# Patient Record
Sex: Female | Born: 1990 | Hispanic: Yes | Smoking: Never smoker
Health system: Southern US, Community
[De-identification: ages and names within clinical notes are randomized; demographics above are authoritative.]

## PROBLEM LIST (undated history)

## (undated) ENCOUNTER — Inpatient Hospital Stay (HOSPITAL_COMMUNITY): Payer: Self-pay

## (undated) DIAGNOSIS — K219 Gastro-esophageal reflux disease without esophagitis: Secondary | ICD-10-CM

## (undated) DIAGNOSIS — R519 Headache, unspecified: Secondary | ICD-10-CM

## (undated) DIAGNOSIS — Z8619 Personal history of other infectious and parasitic diseases: Secondary | ICD-10-CM

## (undated) DIAGNOSIS — A749 Chlamydial infection, unspecified: Secondary | ICD-10-CM

## (undated) DIAGNOSIS — N368 Other specified disorders of urethra: Secondary | ICD-10-CM

## (undated) DIAGNOSIS — Z8669 Personal history of other diseases of the nervous system and sense organs: Secondary | ICD-10-CM

## (undated) DIAGNOSIS — Z789 Other specified health status: Secondary | ICD-10-CM

## (undated) HISTORY — DX: Personal history of other diseases of the nervous system and sense organs: Z86.69

## (undated) HISTORY — DX: Chlamydial infection, unspecified: A74.9

## (undated) HISTORY — DX: Gastro-esophageal reflux disease without esophagitis: K21.9

## (undated) HISTORY — PX: NO PAST SURGERIES: SHX2092

## (undated) HISTORY — DX: Headache, unspecified: R51.9

---

## 2011-03-16 NOTE — L&D Delivery Note (Addendum)
Delivery Note At 1:07 AM a viable female was delivered via Vaginal, Spontaneous Delivery (Presentation: Left Occiput Anterior).  APGAR: 8, 9; weight 7 lb 7.2 oz (3379 g).   Placenta status: Intact, Spontaneous.  Cord: 3 vessels with the following complications: None.    Anesthesia: Epidural  Episiotomy: None Lacerations: 1st degree;Perineal Suture Repair: 4.0 monocryl Est. Blood Loss (mL): 250  Mom to postpartum.  Baby to nursery-stable.  Desires to breastfeed, depo for contraception, desires circ- undecided about IP vs OP  Marge Duncans 01/16/2012, 4:13 AM

## 2011-09-03 ENCOUNTER — Encounter (HOSPITAL_COMMUNITY): Payer: Self-pay | Admitting: *Deleted

## 2011-09-03 ENCOUNTER — Inpatient Hospital Stay (HOSPITAL_COMMUNITY)
Admission: AD | Admit: 2011-09-03 | Discharge: 2011-09-03 | Disposition: A | Discharge status: Home / Self Care | Payer: Self-pay | Source: Ambulatory Visit | Attending: Obstetrics and Gynecology | Admitting: Obstetrics and Gynecology

## 2011-09-03 DIAGNOSIS — N390 Urinary tract infection, site not specified: Secondary | ICD-10-CM | POA: Insufficient documentation

## 2011-09-03 DIAGNOSIS — R109 Unspecified abdominal pain: Secondary | ICD-10-CM | POA: Insufficient documentation

## 2011-09-03 DIAGNOSIS — N949 Unspecified condition associated with female genital organs and menstrual cycle: Secondary | ICD-10-CM | POA: Insufficient documentation

## 2011-09-03 DIAGNOSIS — O2390 Unspecified genitourinary tract infection in pregnancy, unspecified trimester: Secondary | ICD-10-CM

## 2011-09-03 DIAGNOSIS — O2340 Unspecified infection of urinary tract in pregnancy, unspecified trimester: Secondary | ICD-10-CM

## 2011-09-03 HISTORY — DX: Other specified health status: Z78.9

## 2011-09-03 LAB — URINALYSIS, ROUTINE W REFLEX MICROSCOPIC
Glucose, UA: NEGATIVE mg/dL
Protein, ur: NEGATIVE mg/dL
Specific Gravity, Urine: 1.02 (ref 1.005–1.030)
pH: 7.5 (ref 5.0–8.0)

## 2011-09-03 LAB — WET PREP, GENITAL
Trich, Wet Prep: NONE SEEN
Yeast Wet Prep HPF POC: NONE SEEN

## 2011-09-03 LAB — URINE MICROSCOPIC-ADD ON

## 2011-09-03 MED ORDER — NITROFURANTOIN MONOHYD MACRO 100 MG PO CAPS: 100.0000 mg | ORAL_CAPSULE | Freq: Two times a day (BID) | ORAL | Status: AC

## 2011-09-03 NOTE — Discharge Instructions (Signed)
Infeccin del tracto urinario (Urinary Tract Infection) Las infecciones en el tracto urinario pueden comenzar en varios lugares. Una infeccin en la vejiga (cistitis), una infeccin en el rin (pielonefritis) o una infeccin en la prstata (prostatitis) son diferentes tipos de infeccin del tracto urinario. Por lo general mejoran si se los trata con antibiticos. Los antibiticos son medicamentos que matan grmenes. Tome todos los medicamentos que le han recetado hasta que se terminen. Podr sentirse bien dentro de unos das, pero DEBE TOMAR LOS MEDICAMENTOS HASTA TERMINAR EL TRATAMIENTO, de lo contrario la infeccin puede no solucionarse y luego ser ms difcil de tratar. INSTRUCCIONES PARA EL CUIDADO DOMICILIARIO  Beba gran cantidad de lquidos para mantener la orina de tono claro o color amarillo plido. Se recomienda especialmente el jugo de arndanos rojos, adems de grandes cantidades de agua.   Evite la cafena, el t y las bebidas con gas. Estas sustancias irritan la vejiga.   El alcohol puede irritar la prstata.   Utilice los medicamentos de venta libre o de prescripcin para el dolor, el malestar o la fiebre, segn se lo indique el profesional que lo asiste.  PARA PREVENIR FUTURAS INFECCIONES:  Vace la vejiga con frecuencia. Evite retener la orina durante largos perodos.   Despus de mover el intestino, las mujeres deben higienizarse la regin perineal desde adelante hacia atrs. Use cada papel tissue slo una vez.   Vace la vejiga antes y despus de tener relaciones sexuales.  OBTENER LOS RESULTADOS DE LAS PRUEBAS Durante su visita no contar con todos los resultados de los anlisis. En este caso, tenga otra entrevista con su mdico para conocerlos. No piense que el resultado es normal si no tiene noticias de su mdico o de la institucin mdica. Es importante el seguimiento de todos los resultados de los anlisis.  SOLICITE ATENCIN MDICA SI:  Siente dolor en la espalda.    El beb tiene ms de 3 meses y su temperatura rectal es de 100.5 F (38.1 C) o ms durante ms de 1 da.   Los problemas (sntomas) no mejoran en 3 das. Solicite atencin mdica antes si empeora.  SOLICITE ATENCIN MDICA DE INMEDIATO SI:  Comienza a sentir un dolor de espaldas o en la zona abdominal inferior intenso.   Comienza a sentir escalofros.   Tiene fiebre.   Su beb tiene ms de 3 meses y su temperatura rectal es de 102 F (38.9 C) o mayor.   Su beb tiene 3 meses o menos y su temperatura rectal es de 100.4 F (38 C) o mayor.   Siente nuseas o vmitos.   Tiene una sensacin continua de quemazn o molestias al orinar.  EST SEGURO QUE:  Comprende las instrucciones para el alta mdica.   Controlar su enfermedad.   Solicitar atencin mdica de inmediato segn las indicaciones.  Document Released: 12/09/2004 Document Revised: 02/18/2011 [employer] Patient Information 2012 [employer]. 

## 2011-09-03 NOTE — MAU Provider Note (Signed)
History     CSN: [id number]  Arrival date and time: 09/03/11 1142   First Provider Initiated Contact with Patient 09/03/11 1316      Chief Complaint  Patient presents with  . Abdominal Pain   HPI "abd pain for 3 days"  Ms. Felipa Furnace is a 21 yo G1P0 [redacted]w[redacted]d who presents with a 3 day h/o intermittent diffuse pelvic pain which she describes as feeling like her "period is coming on". Pt reports pain started in the lower quadrants and is now felt throughout.Associated sxs include nausea, subjective fever, and urgency.  Pt denies vomiting, d/c, chills, polyuria, or dysuria. Pt also denies any LOF or bleeding. Pt has not taken anything for the pain.  Last PAP: 4/12 - normal, GC/Chlamydia neg No pertinent PMH. No surgical Hx. No sick contacts or recent travel.  OB History    Grav Para Term Preterm Abortions TAB SAB Ect Mult Living   1               Past Medical History  Diagnosis Date  . No pertinent past medical history     Past Surgical History  Procedure Date  . No past surgeries     History reviewed. No pertinent family history.  History  Substance Use Topics  . Smoking status: Never Smoker   . Smokeless tobacco: Never Used  . Alcohol Use: No    Allergies: No Known Allergies  Prescriptions prior to admission  Medication Sig Dispense Refill  . acetaminophen (TYLENOL) 325 MG tablet Take 325 mg by mouth daily as needed. For pain      . Prenatal Vit-Fe Fumarate-FA (PRENATAL MULTIVITAMIN) TABS Take 1 tablet by mouth daily.        Review of Systems  Constitutional: Negative for fever, chills, weight loss and malaise/fatigue.  HENT: Negative for ear pain, congestion and sore throat.   Eyes: Negative for blurred vision, double vision and photophobia.  Respiratory: Negative for cough, shortness of breath and wheezing.   Cardiovascular: Negative for chest pain and palpitations.  Gastrointestinal: Positive for nausea and abdominal pain (diffuse). Negative for heartburn,  vomiting, diarrhea, constipation and blood in stool.  Genitourinary: Positive for urgency. Negative for dysuria, frequency, hematuria and flank pain.  Skin: Negative.   Neurological: Negative for dizziness, seizures and headaches.  Psychiatric/Behavioral: Negative for depression and substance abuse. The patient is not nervous/anxious.    Physical Exam   Blood pressure 110/57, pulse 104, temperature 98.2 F (36.8 C), temperature source Oral, resp. rate 16, height 5' (1.524 m), weight 48.353 kg (106 lb 9.6 oz), last menstrual period 04/23/2011, SpO2 100.00%.  Physical Exam  Constitutional: She appears well-developed and well-nourished. No distress.  HENT:  Head: Normocephalic.  Eyes: Pupils are equal, round, and reactive to light. No scleral icterus.  Cardiovascular: Normal rate, regular rhythm, normal heart sounds and intact distal pulses.   No murmur heard. Respiratory: Effort normal and breath sounds normal. No respiratory distress. She has no wheezes.  GI: Bowel sounds are normal. She exhibits mass (Uterus felt just below level of umblicus consistent with [redacted]w[redacted]d dating). She exhibits no distension. There is tenderness (RUQ and epigastric > lower quadrant). There is no rebound, no CVA tenderness, no tenderness at McBurney's point and negative Murphy's sign.  Neurological: She is alert.  Skin: Skin is warm and dry. No erythema.  Pelvic Exam: externally no masses, rashes or lesions, internally white thin vaginal discharge, no odor, wet prep and GC/chlamydia samples taken, bimanual exam cervical os closed, no  masses  Results for orders placed during the hospital encounter of 09/03/11 (from the past 24 hour(s))  URINALYSIS, ROUTINE W REFLEX MICROSCOPIC     Status: Abnormal   Collection Time   09/03/11  2:04 PM      Component Value Range   Color, Urine YELLOW  YELLOW   APPearance HAZY (*) CLEAR   Specific Gravity, Urine 1.020  1.005 - 1.030   pH 7.5  5.0 - 8.0   Glucose, UA NEGATIVE   NEGATIVE mg/dL   Hgb urine dipstick NEGATIVE  NEGATIVE   Bilirubin Urine NEGATIVE  NEGATIVE   Ketones, ur 15 (*) NEGATIVE mg/dL   Protein, ur NEGATIVE  NEGATIVE mg/dL   Urobilinogen, UA 0.2  0.0 - 1.0 mg/dL   Nitrite NEGATIVE  NEGATIVE   Leukocytes, UA TRACE (*) NEGATIVE  URINE MICROSCOPIC-ADD ON     Status: Abnormal   Collection Time   09/03/11  2:04 PM      Component Value Range   Squamous Epithelial / LPF FEW (*) RARE   WBC, UA 0-2  <3 WBC/hpf  WET PREP, GENITAL     Status: Abnormal   Collection Time   09/03/11  2:31 PM      Component Value Range   Yeast Wet Prep HPF POC NONE SEEN  NONE SEEN   Trich, Wet Prep NONE SEEN  NONE SEEN   Clue Cells Wet Prep HPF POC NONE SEEN  NONE SEEN   WBC, Wet Prep HPF POC FEW (*) NONE SEEN    MAU Course  Procedures  MDM UA - trace leukocytes GC/Chlamydia Wet Prep - trace WBCs Bedside US - FHR seen  Assessment and Plan  1. Pelvic Pain - likely round ligament pain Acetaminophen q6h prn for pain F/u with PCP in 7 days  2. UTI Macrobid BID x 7 days  Ricardo Jericho 09/03/2011, 1:39 PM   Seen and examined by me also. Agree with assessment and plan.

## 2011-09-03 NOTE — MAU Note (Signed)
Pt states she has had abd pain x 3 days in all four quadrants.  Describes like she is getting ready to start her menses and is intermit. In nature.  Denies vaginal bleeding or abdnormal discharge.

## 2011-09-05 LAB — URINE CULTURE
Colony Count: 25000
Culture  Setup Time: 201306220128

## 2011-09-06 LAB — GC/CHLAMYDIA PROBE AMP, GENITAL
Chlamydia, DNA Probe: POSITIVE — AB
GC Probe Amp, Genital: NEGATIVE

## 2011-09-06 NOTE — MAU Provider Note (Signed)
Agree with above note.  Dotsie Gillette 09/06/2011 1:15 PM   

## 2011-09-08 ENCOUNTER — Telehealth: Payer: Self-pay | Admitting: General Practice

## 2011-09-08 NOTE — Telephone Encounter (Signed)
Called patient with interpreter Angie to inform patient that her test results from MAU on 6/21 had come back positive for chlamydia and that she needs to go to [facility] to get treated as well as her partner or partners. Patient asked what chlamydia was and if it was serious or not. Informed patient that it was a sexually transmitted disease and that was probably where her abdominal pain had been coming from and that this was not necessarily serious but that it was very important that she go to the [facility] to get treatment. Patient verbalized understanding and had no further questions

## 2011-10-10 ENCOUNTER — Inpatient Hospital Stay (HOSPITAL_COMMUNITY)
Admit: 2011-10-10 | Discharge: 2011-10-10 | Disposition: A | Discharge status: Home / Self Care | Payer: Self-pay | Source: Ambulatory Visit | Attending: Obstetrics & Gynecology | Admitting: Obstetrics & Gynecology

## 2011-10-10 ENCOUNTER — Encounter (HOSPITAL_COMMUNITY): Payer: Self-pay | Admitting: *Deleted

## 2011-10-10 DIAGNOSIS — M545 Low back pain, unspecified: Secondary | ICD-10-CM | POA: Insufficient documentation

## 2011-10-10 DIAGNOSIS — O99891 Other specified diseases and conditions complicating pregnancy: Secondary | ICD-10-CM | POA: Insufficient documentation

## 2011-10-10 DIAGNOSIS — R109 Unspecified abdominal pain: Secondary | ICD-10-CM | POA: Insufficient documentation

## 2011-10-10 DIAGNOSIS — O4700 False labor before 37 completed weeks of gestation, unspecified trimester: Secondary | ICD-10-CM | POA: Insufficient documentation

## 2011-10-10 DIAGNOSIS — O479 False labor, unspecified: Secondary | ICD-10-CM

## 2011-10-10 LAB — URINE MICROSCOPIC-ADD ON

## 2011-10-10 LAB — URINALYSIS, ROUTINE W REFLEX MICROSCOPIC
Bilirubin Urine: NEGATIVE
Hgb urine dipstick: NEGATIVE
Nitrite: NEGATIVE
Specific Gravity, Urine: 1.02 (ref 1.005–1.030)
Urobilinogen, UA: 0.2 mg/dL (ref 0.0–1.0)
pH: 6 (ref 5.0–8.0)

## 2011-10-10 LAB — WET PREP, GENITAL

## 2011-10-10 NOTE — MAU Note (Signed)
Sharp abdominal and back pain since yesterday. No vaginal bleeding or discharge. Tylenol 1 tab every 8 hours since last night for pain.

## 2011-10-10 NOTE — MAU Provider Note (Signed)
  History     CSN: [id number]  Arrival date and time: 10/10/11 2017   None     Chief Complaint  Patient presents with  . Abdominal Pain   HPI This is a 21 y.o. female at [redacted]w[redacted]d who presents with c/o intermittent lower abdominal and low back pain. States belly does get hard when she has the pains. Last visit was here a month ago and she was positive for Chlamydia.  She states both she and her partner got treated.  [facility]  OB History    Grav Para Term Preterm Abortions TAB SAB Ect Mult Living   1               Past Medical History  Diagnosis Date  . No pertinent past medical history     Past Surgical History  Procedure Date  . No past surgeries     History reviewed. No pertinent family history.  History  Substance Use Topics  . Smoking status: Never Smoker   . Smokeless tobacco: Never Used  . Alcohol Use: No    Allergies: No Known Allergies  Prescriptions prior to admission  Medication Sig Dispense Refill  . acetaminophen (TYLENOL) 325 MG tablet Take 325 mg by mouth daily as needed. For pain      . Prenatal Vit-Fe Fumarate-FA (PRENATAL MULTIVITAMIN) TABS Take 1 tablet by mouth 3 (three) times daily.         Review of Systems  Constitutional: Negative for fever and malaise/fatigue.  Gastrointestinal: Positive for abdominal pain. Negative for nausea, vomiting, diarrhea and constipation.   As listed above.  Physical Exam   Blood pressure 109/62, pulse 99, temperature 98.5 F (36.9 C), temperature source Oral, resp. rate 20, height 5' 1.75" (1.568 m), weight 116 lb 2 oz (52.674 kg), last menstrual period 04/23/2011.  Physical Exam  Constitutional: She is oriented to person, place, and time. She appears well-developed and well-nourished. No distress.  Cardiovascular: Normal rate.   Respiratory: Effort normal.  GI: Soft. She exhibits no distension and no mass. There is no tenderness. There is no rebound and no guarding.    Genitourinary: Vagina normal and uterus normal. No vaginal discharge found.       Cultures done. (test of cure) Cervix long and closed.  Musculoskeletal: Normal range of motion.  Neurological: She is alert and oriented to person, place, and time.  Skin: Skin is warm and dry.  Psychiatric: She has a normal mood and affect.  FHR stable 150s. No contractions  MAU Course  Procedures  Assessment and Plan  A;  SIUP at [redacted]w[redacted]d       Round ligament pain vs BH contractions, closed cervix  P:  Discharge      [facility] appt at [facility]      PTL precautions  [facility] 10/10/2011, 9:40 PM

## 2011-10-11 LAB — GC/CHLAMYDIA PROBE AMP, GENITAL: Chlamydia, DNA Probe: NEGATIVE

## 2011-12-22 ENCOUNTER — Encounter: Payer: Self-pay | Admitting: Obstetrics and Gynecology

## 2011-12-22 ENCOUNTER — Ambulatory Visit (INDEPENDENT_AMBULATORY_CARE_PROVIDER_SITE_OTHER): Payer: Self-pay | Admitting: Obstetrics and Gynecology

## 2011-12-22 VITALS — BP 112/75 | Temp 98.5°F | Ht 61.0 in | Wt 131.0 lb

## 2011-12-22 DIAGNOSIS — O98319 Other infections with a predominantly sexual mode of transmission complicating pregnancy, unspecified trimester: Secondary | ICD-10-CM

## 2011-12-22 DIAGNOSIS — A749 Chlamydial infection, unspecified: Secondary | ICD-10-CM | POA: Insufficient documentation

## 2011-12-22 DIAGNOSIS — Z3400 Encounter for supervision of normal first pregnancy, unspecified trimester: Secondary | ICD-10-CM | POA: Insufficient documentation

## 2011-12-22 DIAGNOSIS — O0930 Supervision of pregnancy with insufficient antenatal care, unspecified trimester: Secondary | ICD-10-CM | POA: Insufficient documentation

## 2011-12-22 DIAGNOSIS — A568 Sexually transmitted chlamydial infection of other sites: Secondary | ICD-10-CM

## 2011-12-22 DIAGNOSIS — Z23 Encounter for immunization: Secondary | ICD-10-CM

## 2011-12-22 LAB — POCT URINALYSIS DIP (DEVICE)
Bilirubin Urine: NEGATIVE
Glucose, UA: NEGATIVE mg/dL
Hgb urine dipstick: NEGATIVE
Leukocytes, UA: NEGATIVE
Nitrite: NEGATIVE
Urobilinogen, UA: 0.2 mg/dL (ref 0.0–1.0)

## 2011-12-22 MED ORDER — INFLUENZA VIRUS VACC SPLIT PF IM SUSP: 0.5000 mL | Freq: Once | INTRAMUSCULAR | Status: DC

## 2011-12-22 NOTE — Patient Instructions (Signed)
Embarazo  Tercer trimestre  (Pregnancy - Third Trimester) El tercer trimestre del embarazo (los ltimos 3 meses) es el perodo en el cual tanto usted como su beb crecen con ms rapidez. El beb alcanza un largo de aproximadamente 50 cm. y pesa entre 2,700 y 4,500 kg. El beb gana ms tejido graso y est listo para la vida fuera del cuerpo de la madre. Mientras estn en el interior, los bebs tienen perodos de sueo y vigilia, succionan el pulgar y tienen hipo. Quizs sienta pequeas contracciones del tero. Este es el falso trabajo de parto. Tambin se las conoce como contracciones de Braxton-Hicks . Es como una prctica del parto. Los problemas ms habituales de esta etapa del embarazo incluyen mayor dificultad para respirar, hinchazn de las manos y los pies por retencin de lquidos y la necesidad de orinar con ms frecuencia debido a que el tero y el beb presionan sobre la vejiga.  EXAMENES PRENATALES   Durante los exmenes prenatales, deber seguir realizndose anlisis de sangre. Estas pruebas se realizan para controlar su salud y la del beb. Los anlisis de sangre se realizan para conocer los niveles de algunos compuestos de la sangre (hemoglobina). La anemia (bajo nivel de hemoglobina) es frecuente durante el embarazo. Para prevenirla, se administran hierro y vitaminas. Tambin le tomarn nuevas anlisis para descartar diabetes. Podrn repetirle algunas de las pruebas que le hicieron previamente.  En cada visita le medirn el tamao del tero. Esto permite asegurar que el beb se desarrolla adecuadamente, segn la fecha del embarazo.  Le controlarn la presin arterial en cada visita prenatal. Esto es para asegurarse de que no sufre toxemia.  Le harn un anlisis de orina en cada visita prenatal, para descartar infecciones, diabetes y la presencia de protenas.  Tambin en cada visita controlarn su peso. Esto se realiza para asegurarse que aumenta de peso al ritmo indicado y que usted y su  beb evolucionan normalmente.  En algunas ocasiones se realiza una prueba de ultrasonido para confirmar el correcto desarrollo y evolucin del beb. Esta prueba se realiza con ondas sonoras inofensivas para el beb, de modo que el profesional pueda calcular ms precisamente la fecha del parto.  Analice con su mdico los analgsicos y la anestesia que recibir durante el trabajo de parto y el parto.  Comente la posibilidad de que necesite una cesrea y qu anestesia se recibir.  Informe a su mdico si sufre violencia familiar mental o fsica. A veces, se indica la prueba especializada sin estrs, la prueba de tolerancia a las contracciones y el perfil biofsico para asegurarse de que el beb no tiene problemas. El estudio del lquido amnitico que rodea al beb se llama amniocentesis. El lquido amnitico se obtiene introduciendo una aguja en el vientre (abdomen ). En ocasiones se lleva a cabo cerca del final del embarazo, si es necesario inducir a un parto. En este caso se realiza para asegurarse que los pulmones del beb estn lo suficientemente maduros como para que pueda vivir fuera del tero. Si los pulmones no han madurado y es peligroso que el beb nazca, se administrar a la madre una inyeccin de cortisona , 1 a 2 das antes del parto. . Esto ayuda a que los pulmones del beb maduren y sea ms seguro su nacimiento.  CAMBIOS QUE OCURREN EN EL TERCER TRIMESTRE DEL EMBARAZO  Su organismo atravesar numerosos cambios durante el embarazo. Estos pueden variar de una persona a otra. Converse con el profesional que la asiste acerca los cambios que   usted note y que la preocupen.   Durante el ltimo trimestre probablemente sienta un aumento del apetito. Es normal tener "antojos" de ciertas comidas. Esto vara de una persona a otra y de un embarazo a otro.  Podrn aparecer las primeras estras en las caderas, abdomen y mamas. Estos son cambios normales del cuerpo durante el embarazo. No existen  medicamentos ni ejercicios que puedan prevenir estos cambios.  La constipacin puede tratarse con un laxante o agregando fibra a su dieta. Beber grandes cantidades de lquidos, tomar fibras en forma de vegetales, frutas y granos integrales es de gran ayuda.  Tambin es beneficioso practicar actividad fsica. Si ha sido una persona activa hasta el embarazo, podr continuar con la mayora de las actividades durante el mismo. Si ha sido menos activa, puede ser beneficioso que comience con un programa de ejercicios, como realizar caminatas. Consulte con el profesional que la asiste antes de comenzar un programa de ejercicios.  Evite el consumo de cigarrillos, el alcohol, los medicamentos no recetados y las "drogas de la calle" durante el embarazo. Estas sustancias qumicas afectan la formacin y el desarrollo del beb. Evite estas sustancias durante todo el embarazo para asegurar el nacimiento de un beb sano.  Podr sentir dolor de espalda, tener vrices en las venas y hemorroides, o si ya los sufra, pueden empeorar.  Durante el tercer trimestre se cansar con ms facilidad, lo cual es normal.  Los movimientos del beb pueden ser ms fuertes y con ms frecuencia.  Puede que note dificultades para respirar normalmente.  El ombligo puede salir hacia afuera.  A veces sale una secrecin amarilla de las mamas, que se llama calostro.  Podr aparecer una secrecin mucosa con sangre. Esto suele ocurrir entre unos pocos das y una semana antes del parto. INSTRUCCIONES PARA EL CUIDADO EN EL HOGAR   Cumpla con las citas de control. Siga las indicaciones del mdico con respecto al uso de medicamentos, los ejercicios y la dieta.  Durante el embarazo debe obtener nutrientes para usted y para su beb. Consuma alimentos balanceados a intervalos regulares. Elija alimentos como carne, pescado, leche y otros productos lcteos descremados, vegetales, frutas, panes integrales y cereales. El mdico le informar  cul es el aumento de peso ideal.  Las relaciones sexuales pueden continuarse hasta casi el final del embarazo, si no se presentan otros problemas como prdida prematura (antes de tiempo) de lquido amnitico, hemorragia vaginal o dolor en el vientre (abdominal).  Realice actividad fsica todos los das, si no tiene restricciones. Consulte con el profesional que la asiste si no sabe con certeza si determinados ejercicios son seguros. El mayor aumento de peso se producir en los ltimos 2 trimestres del embarazo. El ejercicio ayuda a:  Controlar su peso.  Mantenerse en forma para el trabajo de parto y el parto .  Perder peso despus del parto.  Haga reposo con frecuencia, con las piernas elevadas, o segn lo necesite para evitar los calambres y el dolor de cintura.  Use un buen sostn o como los que se usan para hacer deportes para aliviar la sensibilidad de las mamas. Tambin puede serle til si lo usa mientras duerme. Si pierde calostro, podr utilizar apsitos en el sostn.  No utilice la baera con agua caliente, baos turcos y saunas.  Colquese el cinturn de seguridad cuando conduzca. Este la proteger a usted y al beb en caso de accidente.  Evite comer carne cruda y el contacto con los utensilios y desperdicios de los gatos. Estos elementos   contienen grmenes que pueden causar defectos de nacimiento en el beb.  Es fcil perder algo de orina durante el embarazo. Apretar y fortalecer los msculos de la pelvis la ayudar con este problema. Practique detener la miccin cuando est en el bao. Estos son los mismos msculos que necesita fortalecer. Son tambin los mismos msculos que utiliza cuando trata de evitar despedir gases. Puede practicar apretando estos msculos diez veces, y repetir esto tres veces por da aproximadamente. Una vez que conozca qu msculos debe apretar, no realice estos ejercicios durante la miccin. Puede favorecerle una infeccin si la orina vuelve hacia  atrs.  Pida ayuda si tienen necesidades financieras, teraputicas o nutricionales. El profesional podr ayudarla con respecto a estas necesidades, o derivarla a otros especialistas.  Haga una lista de nmeros telefnicos de emergencia y tngalos disponibles.  Planifique como obtener ayuda de familiares o amigos cuando regrese a casa desde el hospital.  Hacer un ensayo sobre la partida al hospital.  Tome clases prenatales con el padre para entender, practicar y hacer preguntas sobre el trabajo de parto y el alumbramiento.  Preparar la habitacin del beb / busque una guardera.  No viaje fuera de la ciudad a menos que sea absolutamente necesario y con el asesoramiento de su mdico.  Use slo zapatos de tacn bajo o sin tacn para tener mejor equilibrio y evitar cadas. USO DE MEDICAMENTOS Y CONSUMO DE DROGAS DURANTE EL EMBARAZO   Tome las vitaminas apropiadas para esta etapa tal como se le indic. Las vitaminas deben contener un miligramo de cido flico. Guarde todas las vitaminas fuera del alcance de los nios. La ingestin de slo un par de vitaminas o tabletas que contengan hierro pueden ocasionar la muerte en un beb o en un nio pequeo.  Evite el uso de todos los medicamentos, incluyendo hierbas, medicamentos de venta libre, sin receta o que no hayan sido sugeridos por su mdico. Slo tome medicamentos de venta libre o medicamentos recetados para el dolor, el malestar o fiebre como lo indique su mdico. No tome aspirina, ibuprofeno (Motrin, Advil, Nuprin) o naproxeno (Aleve) excepto que su mdico se lo indique.  Infrmele al profesional si consume alguna droga.  El alcohol se relaciona con ciertos defectos congnitos. Incluye el sndrome de alcoholismo fetal. Debe evitar absolutamente el consumo de alcohol, en cualquier forma. El fumar produce baja tasa de natalidad y bebs prematuros.  Las drogas ilegales o de la calle son muy perjudiciales para el beb. Estn absolutamente  prohibidas. Un beb que nace de una madre adicta, ser adicto al nacer. Ese beb tendr los mismos sntomas de abstinencia que un adulto. SOLICITE ATENCIN MDICA SI:  Tiene preguntas o preocupaciones relacionadas con el embarazo. Es mejor que llame para formular las preguntas si no puede esperar hasta la prxima visita, que sentirse preocupada por ellas.  DECISIONES ACERCA DE LA CIRCUNCISIN  Usted puede saber o no cul es el sexo de su beb. Si ya sabe que ser un varn, este es el momento de pensar acerca de la circuncisin. La circuncisin es la extirpacin del prepucio. Esta es la piel que cubre el extremo sensible del pene. No hay un motivo mdico que lo justifique. Generalmente la decisin se toma segn lo que sea popular en ese momento, o segn creencias religiosas. Podr conversar estos temas con su mdico o con el pediatra.  SOLICITE ATENCIN MDICA DE INMEDIATO SI:   La temperatura oral le sube a ms de 102 F (38.9 C) o lo que su mdico le   indique.  Tiene una prdida de lquido por la vagina (canal de parto). Si sospecha una ruptura de las membranas, tmese la temperatura y llame al profesional para informarlo sobre esto.  Observa unas pequeas manchas, una hemorragia vaginal o elimina cogulos. Notifique al profesional acerca de la cantidad y de cuntos apsitos est utilizando.  Presenta un olor desagradable en la secrecin vaginal y observa un cambio en el color, de transparente a blanco.  Ha vomitado durante ms de 24 horas.  Siente escalofros o le sube la fiebre.  Le falta el aire.  Siente ardor al orinar.  Baja o sube ms de 2 libras (900 g), o segn lo indicado por el profesional que la asiste.  Observa que sbitamente se le hinchan el rostro, las manos, los pies o las piernas.  Siente dolor en el vientre (abdominal). Las molestias en el ligamento redondo son una causa benigna frecuente de dolor abdominal durante el embarazo. El profesional que la asiste deber  evaluarla.  Presenta dolor de cabeza intenso que no se alivia.  Tiene problemas visuales, visin doble o borrosa.  Si no siente los movimientos del beb durante ms de 1 hora. Si piensa que el beb no se mueve tanto como lo haca habitualmente, coma algo que contenga azcar y recustese sobre el lado izquierdo durante una hora. El beb debe moverse al menos 4  5 veces por hora. Comunquese inmediatamente si el beb se mueve menos que lo indicado.  Se cae, se ve involucrada en un accidente automovilstico o sufre algn tipo de traumatismo.  En su hogar hay violencia mental o fsica. Document Released: 12/09/2004 Document Revised: 08/31/2011 [employer] Patient Information 2013 [employer].  

## 2011-12-22 NOTE — Progress Notes (Signed)
Patient desires flu shot today and tdap at next visit.

## 2011-12-22 NOTE — Progress Notes (Signed)
Pulse 126

## 2011-12-22 NOTE — Progress Notes (Signed)
NOB visit  S;21 yo Hispanic female at 34 weeks 5 days by LMP who has had 2 MAU visits for minor complaints and no known pregnancy problems except Chlamydia which was treated and had negative test of cure. Denies irritative vaginitis, postcoital spotting, dysuria. I have reviewed her medications allergies past medical history and surgical history.  O: vital signs as above. Pulse recheck 104. Gen.: WN WD in NAD HEENT: Good dentition Neck: No thyromegaly Coronary: RRR no murmur Lungs: CTA bilateral Abdomen: Soft nontender, size equals dates Extremities: No edema or varicosities, pulse nl A: 21 year old Hispanic G1 with no prenatal care at 34 weeks 5 days Chlamydia this pregnancy treated with negative test of cure; friable cervix and abnormal appears to vaginal discharge today Language barrier P: prenatal labs, one-hour Glucola, tDAP, flu vaccine, wet prep done;  anatomic ultrasound scheduled Counseling pregnancy precautions and plans Follow up one week

## 2011-12-22 NOTE — Addendum Note (Signed)
Addended by: Gerome Apley on: 12/22/2011 09:42 AM   Modules accepted: Orders

## 2011-12-22 NOTE — Addendum Note (Signed)
Addended by: Franchot Mimes on: 12/22/2011 10:17 AM   Modules accepted: Orders

## 2011-12-23 LAB — WET PREP, GENITAL: Clue Cells Wet Prep HPF POC: NONE SEEN

## 2011-12-23 LAB — HIV ANTIBODY (ROUTINE TESTING W REFLEX): HIV: NONREACTIVE

## 2011-12-24 ENCOUNTER — Ambulatory Visit (HOSPITAL_COMMUNITY)
Admission: RE | Admit: 2011-12-24 | Discharge: 2011-12-24 | Disposition: A | Discharge status: Home / Self Care | Payer: Self-pay | Source: Ambulatory Visit | Attending: Obstetrics and Gynecology | Admitting: Obstetrics and Gynecology

## 2011-12-24 DIAGNOSIS — O0930 Supervision of pregnancy with insufficient antenatal care, unspecified trimester: Secondary | ICD-10-CM | POA: Insufficient documentation

## 2011-12-24 DIAGNOSIS — Z3689 Encounter for other specified antenatal screening: Secondary | ICD-10-CM | POA: Insufficient documentation

## 2011-12-24 DIAGNOSIS — Z3400 Encounter for supervision of normal first pregnancy, unspecified trimester: Secondary | ICD-10-CM

## 2011-12-24 LAB — OBSTETRIC PANEL
Antibody Screen: NEGATIVE
Basophils Relative: 0 % (ref 0–1)
Eosinophils Absolute: 0.1 10*3/uL (ref 0.0–0.7)
Eosinophils Relative: 1 % (ref 0–5)
HCT: 37.7 % (ref 36.0–46.0)
Hemoglobin: 12.6 g/dL (ref 12.0–15.0)
Hepatitis B Surface Ag: NEGATIVE
Lymphs Abs: 1.9 10*3/uL (ref 0.7–4.0)
MCH: 31.2 pg (ref 26.0–34.0)
MCHC: 33.4 g/dL (ref 30.0–36.0)
MCV: 93.3 fL (ref 78.0–100.0)
Monocytes Absolute: 0.9 10*3/uL (ref 0.1–1.0)
Monocytes Relative: 9 % (ref 3–12)
RBC: 4.04 MIL/uL (ref 3.87–5.11)
Rh Type: POSITIVE

## 2011-12-25 LAB — CULTURE, OB URINE: Colony Count: 50000

## 2011-12-29 ENCOUNTER — Ambulatory Visit (INDEPENDENT_AMBULATORY_CARE_PROVIDER_SITE_OTHER): Payer: Self-pay | Admitting: Advanced Practice Midwife

## 2011-12-29 VITALS — BP 126/76 | Temp 97.6°F | Wt 133.5 lb

## 2011-12-29 DIAGNOSIS — O0930 Supervision of pregnancy with insufficient antenatal care, unspecified trimester: Secondary | ICD-10-CM

## 2011-12-29 LAB — POCT URINALYSIS DIP (DEVICE)
Ketones, ur: NEGATIVE mg/dL
Leukocytes, UA: NEGATIVE
Protein, ur: NEGATIVE mg/dL
Specific Gravity, Urine: 1.015 (ref 1.005–1.030)
Urobilinogen, UA: 0.2 mg/dL (ref 0.0–1.0)
pH: 7 (ref 5.0–8.0)

## 2011-12-29 NOTE — Progress Notes (Signed)
Pulse- 98 

## 2011-12-29 NOTE — Patient Instructions (Signed)
Embarazo  Tercer trimestre  (Pregnancy - Third Trimester) El tercer trimestre del embarazo (los ltimos 3 meses) es el perodo en el cual tanto usted como su beb crecen con ms rapidez. El beb alcanza un largo de aproximadamente 50 cm. y pesa entre 2,700 y 4,500 kg. El beb gana ms tejido graso y est listo para la vida fuera del cuerpo de la madre. Mientras estn en el interior, los bebs tienen perodos de sueo y vigilia, succionan el pulgar y tienen hipo. Quizs sienta pequeas contracciones del tero. Este es el falso trabajo de parto. Tambin se las conoce como contracciones de Braxton-Hicks . Es como una prctica del parto. Los problemas ms habituales de esta etapa del embarazo incluyen mayor dificultad para respirar, hinchazn de las manos y los pies por retencin de lquidos y la necesidad de orinar con ms frecuencia debido a que el tero y el beb presionan sobre la vejiga.  EXAMENES PRENATALES   Durante los exmenes prenatales, deber seguir realizndose anlisis de sangre. Estas pruebas se realizan para controlar su salud y la del beb. Los anlisis de sangre se realizan para conocer los niveles de algunos compuestos de la sangre (hemoglobina). La anemia (bajo nivel de hemoglobina) es frecuente durante el embarazo. Para prevenirla, se administran hierro y vitaminas. Tambin le tomarn nuevas anlisis para descartar diabetes. Podrn repetirle algunas de las pruebas que le hicieron previamente.  En cada visita le medirn el tamao del tero. Esto permite asegurar que el beb se desarrolla adecuadamente, segn la fecha del embarazo.  Le controlarn la presin arterial en cada visita prenatal. Esto es para asegurarse de que no sufre toxemia.  Le harn un anlisis de orina en cada visita prenatal, para descartar infecciones, diabetes y la presencia de protenas.  Tambin en cada visita controlarn su peso. Esto se realiza para asegurarse que aumenta de peso al ritmo indicado y que usted y  su beb evolucionan normalmente.  En algunas ocasiones se realiza una prueba de ultrasonido para confirmar el correcto desarrollo y evolucin del beb. Esta prueba se realiza con ondas sonoras inofensivas para el beb, de modo que el profesional pueda calcular ms precisamente la fecha del parto.  Analice con su mdico los analgsicos y la anestesia que recibir durante el trabajo de parto y el parto.  Comente la posibilidad de que necesite una cesrea y qu anestesia se recibir.  Informe a su mdico si sufre violencia familiar mental o fsica. A veces, se indica la prueba especializada sin estrs, la prueba de tolerancia a las contracciones y el perfil biofsico para asegurarse de que el beb no tiene problemas. El estudio del lquido amnitico que rodea al beb se llama amniocentesis. El lquido amnitico se obtiene introduciendo una aguja en el vientre (abdomen ). En ocasiones se lleva a cabo cerca del final del embarazo, si es necesario inducir a un parto. En este caso se realiza para asegurarse que los pulmones del beb estn lo suficientemente maduros como para que pueda vivir fuera del tero. Si los pulmones no han madurado y es peligroso que el beb nazca, se administrar a la madre una inyeccin de cortisona , 1 a 2 das antes del parto. . Esto ayuda a que los pulmones del beb maduren y sea ms seguro su nacimiento.  CAMBIOS QUE OCURREN EN EL TERCER TRIMESTRE DEL EMBARAZO  Su organismo atravesar numerosos cambios durante el embarazo. Estos pueden variar de una persona a otra. Converse con el profesional que la asiste acerca los cambios que   usted note y que la preocupen.   Durante el ltimo trimestre probablemente sienta un aumento del apetito. Es normal tener "antojos" de ciertas comidas. Esto vara de una persona a otra y de un embarazo a otro.  Podrn aparecer las primeras estras en las caderas, abdomen y mamas. Estos son cambios normales del cuerpo durante el embarazo. No existen  medicamentos ni ejercicios que puedan prevenir estos cambios.  La constipacin puede tratarse con un laxante o agregando fibra a su dieta. Beber grandes cantidades de lquidos, tomar fibras en forma de vegetales, frutas y granos integrales es de gran ayuda.  Tambin es beneficioso practicar actividad fsica. Si ha sido una persona activa hasta el embarazo, podr continuar con la mayora de las actividades durante el mismo. Si ha sido menos activa, puede ser beneficioso que comience con un programa de ejercicios, como realizar caminatas. Consulte con el profesional que la asiste antes de comenzar un programa de ejercicios.  Evite el consumo de cigarrillos, el alcohol, los medicamentos no recetados y las "drogas de la calle" durante el embarazo. Estas sustancias qumicas afectan la formacin y el desarrollo del beb. Evite estas sustancias durante todo el embarazo para asegurar el nacimiento de un beb sano.  Podr sentir dolor de espalda, tener vrices en las venas y hemorroides, o si ya los sufra, pueden empeorar.  Durante el tercer trimestre se cansar con ms facilidad, lo cual es normal.  Los movimientos del beb pueden ser ms fuertes y con ms frecuencia.  Puede que note dificultades para respirar normalmente.  El ombligo puede salir hacia afuera.  A veces sale una secrecin amarilla de las mamas, que se llama calostro.  Podr aparecer una secrecin mucosa con sangre. Esto suele ocurrir entre unos pocos das y una semana antes del parto. INSTRUCCIONES PARA EL CUIDADO EN EL HOGAR   Cumpla con las citas de control. Siga las indicaciones del mdico con respecto al uso de medicamentos, los ejercicios y la dieta.  Durante el embarazo debe obtener nutrientes para usted y para su beb. Consuma alimentos balanceados a intervalos regulares. Elija alimentos como carne, pescado, leche y otros productos lcteos descremados, vegetales, frutas, panes integrales y cereales. El mdico le informar  cul es el aumento de peso ideal.  Las relaciones sexuales pueden continuarse hasta casi el final del embarazo, si no se presentan otros problemas como prdida prematura (antes de tiempo) de lquido amnitico, hemorragia vaginal o dolor en el vientre (abdominal).  Realice actividad fsica todos los das, si no tiene restricciones. Consulte con el profesional que la asiste si no sabe con certeza si determinados ejercicios son seguros. El mayor aumento de peso se producir en los ltimos 2 trimestres del embarazo. El ejercicio ayuda a:  Controlar su peso.  Mantenerse en forma para el trabajo de parto y el parto .  Perder peso despus del parto.  Haga reposo con frecuencia, con las piernas elevadas, o segn lo necesite para evitar los calambres y el dolor de cintura.  Use un buen sostn o como los que se usan para hacer deportes para aliviar la sensibilidad de las mamas. Tambin puede serle til si lo usa mientras duerme. Si pierde calostro, podr utilizar apsitos en el sostn.  No utilice la baera con agua caliente, baos turcos y saunas.  Colquese el cinturn de seguridad cuando conduzca. Este la proteger a usted y al beb en caso de accidente.  Evite comer carne cruda y el contacto con los utensilios y desperdicios de los gatos. Estos elementos   contienen grmenes que pueden causar defectos de nacimiento en el beb.  Es fcil perder algo de orina durante el embarazo. Apretar y fortalecer los msculos de la pelvis la ayudar con este problema. Practique detener la miccin cuando est en el bao. Estos son los mismos msculos que necesita fortalecer. Son tambin los mismos msculos que utiliza cuando trata de evitar despedir gases. Puede practicar apretando estos msculos diez veces, y repetir esto tres veces por da aproximadamente. Una vez que conozca qu msculos debe apretar, no realice estos ejercicios durante la miccin. Puede favorecerle una infeccin si la orina vuelve hacia  atrs.  Pida ayuda si tienen necesidades financieras, teraputicas o nutricionales. El profesional podr ayudarla con respecto a estas necesidades, o derivarla a otros especialistas.  Haga una lista de nmeros telefnicos de emergencia y tngalos disponibles.  Planifique como obtener ayuda de familiares o amigos cuando regrese a casa desde el hospital.  Hacer un ensayo sobre la partida al hospital.  Tome clases prenatales con el padre para entender, practicar y hacer preguntas sobre el trabajo de parto y el alumbramiento.  Preparar la habitacin del beb / busque una guardera.  No viaje fuera de la ciudad a menos que sea absolutamente necesario y con el asesoramiento de su mdico.  Use slo zapatos de tacn bajo o sin tacn para tener mejor equilibrio y evitar cadas. USO DE MEDICAMENTOS Y CONSUMO DE DROGAS DURANTE EL EMBARAZO   Tome las vitaminas apropiadas para esta etapa tal como se le indic. Las vitaminas deben contener un miligramo de cido flico. Guarde todas las vitaminas fuera del alcance de los nios. La ingestin de slo un par de vitaminas o tabletas que contengan hierro pueden ocasionar la muerte en un beb o en un nio pequeo.  Evite el uso de todos los medicamentos, incluyendo hierbas, medicamentos de venta libre, sin receta o que no hayan sido sugeridos por su mdico. Slo tome medicamentos de venta libre o medicamentos recetados para el dolor, el malestar o fiebre como lo indique su mdico. No tome aspirina, ibuprofeno (Motrin, Advil, Nuprin) o naproxeno (Aleve) excepto que su mdico se lo indique.  Infrmele al profesional si consume alguna droga.  El alcohol se relaciona con ciertos defectos congnitos. Incluye el sndrome de alcoholismo fetal. Debe evitar absolutamente el consumo de alcohol, en cualquier forma. El fumar produce baja tasa de natalidad y bebs prematuros.  Las drogas ilegales o de la calle son muy perjudiciales para el beb. Estn absolutamente  prohibidas. Un beb que nace de una madre adicta, ser adicto al nacer. Ese beb tendr los mismos sntomas de abstinencia que un adulto. SOLICITE ATENCIN MDICA SI:  Tiene preguntas o preocupaciones relacionadas con el embarazo. Es mejor que llame para formular las preguntas si no puede esperar hasta la prxima visita, que sentirse preocupada por ellas.  DECISIONES ACERCA DE LA CIRCUNCISIN  Usted puede saber o no cul es el sexo de su beb. Si ya sabe que ser un varn, este es el momento de pensar acerca de la circuncisin. La circuncisin es la extirpacin del prepucio. Esta es la piel que cubre el extremo sensible del pene. No hay un motivo mdico que lo justifique. Generalmente la decisin se toma segn lo que sea popular en ese momento, o segn creencias religiosas. Podr conversar estos temas con su mdico o con el pediatra.  SOLICITE ATENCIN MDICA DE INMEDIATO SI:   La temperatura oral le sube a ms de 102 F (38.9 C) o lo que su mdico le   indique.  Tiene una prdida de lquido por la vagina (canal de parto). Si sospecha una ruptura de las membranas, tmese la temperatura y llame al profesional para informarlo sobre esto.  Observa unas pequeas manchas, una hemorragia vaginal o elimina cogulos. Notifique al profesional acerca de la cantidad y de cuntos apsitos est utilizando.  Presenta un olor desagradable en la secrecin vaginal y observa un cambio en el color, de transparente a blanco.  Ha vomitado durante ms de 24 horas.  Siente escalofros o le sube la fiebre.  Le falta el aire.  Siente ardor al orinar.  Baja o sube ms de 2 libras (900 g), o segn lo indicado por el profesional que la asiste.  Observa que sbitamente se le hinchan el rostro, las manos, los pies o las piernas.  Siente dolor en el vientre (abdominal). Las molestias en el ligamento redondo son una causa benigna frecuente de dolor abdominal durante el embarazo. El profesional que la asiste deber  evaluarla.  Presenta dolor de cabeza intenso que no se alivia.  Tiene problemas visuales, visin doble o borrosa.  Si no siente los movimientos del beb durante ms de 1 hora. Si piensa que el beb no se mueve tanto como lo haca habitualmente, coma algo que contenga azcar y recustese sobre el lado izquierdo durante una hora. El beb debe moverse al menos 4  5 veces por hora. Comunquese inmediatamente si el beb se mueve menos que lo indicado.  Se cae, se ve involucrada en un accidente automovilstico o sufre algn tipo de traumatismo.  En su hogar hay violencia mental o fsica. Document Released: 12/09/2004 Document Revised: 08/31/2011 [employer] Patient Information 2013 [employer].  Lactancia materna  (Breastfeeding) Decidir amamantar es una de las mejores elecciones que puede hacer por usted y su beb. La informacin que se brinda a continuacin le dar una breve visin de los beneficios de la lactancia materna as como de las dudas ms frecuentes alrededor de ella.  LOS BENEFICIOS DE AMAMANTAR  Para el beb   La primera leche (calostro ) ayuda al mejor funcionamiento del sistema digestivo del beb.   La leche tiene anticuerpos que provienen de la madre y que ayudan a prevenir las infecciones en el beb.   El beb tiene una menor incidencia de asma, alergias y del sndrome de muerte sbita del lactante (SMSL).   Los nutrientes en la leche materna son mejores para el beb que los preparados para lactantes y la leche materna ayuda a un mejor desarrollo del cerebro del beb.   Los bebs amamantados tienen menos gases, clicos y estreimiento.  Para la mam   La lactancia materna favorece el desarrollo de un vnculo muy especial entre la madre y el beb.   Es ms conveniente, siempre disponible y a la temperatura adecuada y econmico.   Consume caloras en la madre y la ayuda a perder el peso ganado durante el embarazo.   Favorece la contraccin del tero a su  tamao normal, de manera ms rpida y disminuye las hemorragias luego del parto.   Las madres que amamantan tienen menor riesgo de desarrollar cncer de mama.  FRECUENCIA DEL AMAMANTAMIENTO   Un beb sano, nacido a trmino, puede amamantarse con tanta frecuencia como cada hora, o espaciar las comidas cada tres horas.   Observe al beb cuando manifieste signos de hambre. Amamante a su beb si muestra signos de hambre. Esta frecuencia variar de un beb a otro.   Amamntelo tan seguido como el beb lo   solicite, o cuando usted sienta la necesidad de aliviar sus mamas.   Despierte al beb si han pasado 3  4 horas desde la ltima comida.   El amamantamiento frecuente la ayudar a producir ms leche y a prevenir problemas de dolor en los pezones e hinchazn de las mamas.  POSICIN DEL BEBE PARA EL AMAMANTAMIENTO   Ya sea que se encuentre acostada o sentada, asegrese que el abdomen del beb enfrente el suyo.   Sostenga la mama con el pulgar por arriba y los otros 4 dedos por debajo. Asegrese que sus dedos se encuentren lejos del pezn y de la boca del beb.   Empuje suavemente los labios del beb con el pezn o con el dedo.   Cuando la boca del beb se abra lo suficiente, introduzca el pezn y la areola tanto como le sea posible dentro de la boca.   Coloque al beb cerca suyo de modo que su nariz y mejillas toquen las mamas al mamar.  ALIMENTACIN Y SUCCIN   La duracin de cada comida vara de un beb a otro y de una comida a otra.   El beb debe succionar entre 2 y 3 minutos para que le llegue leche. Esto se denomina "bajada". Por este motivo, permita que el nio se alimente en cada mama todo lo que desee. Terminar de mamar cuando haya recibido la cantidad adecuada de nutrientes.   Para detener la succin coloque su dedo en la comisura de la boca del nio y deslcelo entre sus encas antes de quitarle la mama de la boca. Esto la ayudar a evitar el dolor en los pezones.   COMO SABER SI EL BEB OBTIENE LA SUFICIENTE LECHE MATERNA  Preguntarse si el beb obtiene la cantidad suficiente de leche es una preocupacin frecuente entre las madres. Puede asegurarse que el beb tiene la leche suficiente si:   El beb succiona activamente y usted escucha que traga .   El beb parece estar relajado y satisfecho despus de mamar.   El nio se alimenta al menos 8 a 12 veces en 24 horas. Alimntelo hasta que se desprenda por sus propios medios o se quede dormido en la primera mama (al menos durante 10 a 20 minutos), luego ofrzcale el otro lado.   El beb moja 5 a 6 paales desechables (6 a 8 paales de tela) en 24 horas cuando tiene 5  6 das de vida.   Tiene al menos 3 a 4 deposiciones todos los das en los primeros meses. La materia fecal debe ser blanda y amarillenta.   El beb debe aumentar 4 a 6 libras (120 a 170 gr.) por semana despus de los 4 das de vida.   Siente que las mamas se ablandan despus de amamantar  REDUCIR LA CONGESTIN DE LAS MAMAS   Durante la primera semana despus del parto, usted puede experimentar hinchazn en las mamas. Cuando las mamas estn congestionadas, se sienten calientes, llenas y molestas al tacto. Puede reducir la congestin si:   Lo amamanta frecuentemente, cada 2-3 horas. Las mams que amamantan pronto y con frecuencia tienen menos problemas de congestin.   Coloque compresas de hielo en sus mamas durante 10-20 minutos entre cada amamantamiento. Esto ayuda a reducir la hinchazn. Envuelva las bolsas de hielo en una toalla liviana para proteger su piel. Las bolsas de vegetales congelados funcionan bien para este propsito.   Tome una ducha tibia o aplique compresas hmedas calientes en las mamas durante 5 a 10 minutos antes de   cada vez que amamanta. Esto aumenta la circulacin y ayuda a que la leche fluya.   Masajee suavemente la mama antes y durante la alimentacin. Con las puntas de los dedos, masajee desde la pared  torcica hacia abajo hasta llegar al pezn, con movimientos circulares.   Asegrese que el nio vaca al menos una mama antes de cambiar de lado.   Use un sacaleche para vaciar la mama si el beb se duerme o no se alimenta bien. Tambin podr quitarse la leche con esa bomba si tiene que volver al trabajo o siente que las mamas estn congestionadas.   Evite los biberones, chupetes o complementar la alimentacin con agua o jugos en lugar de la leche materna. La leche materna es todo el alimento que el beb necesita. No es necesario que el nio ingiera agua o preparados de bibern. De hecho, es lo mejor para ayudar a que las mamas produzcan ms leche. no darle suplementos al nio durante las primeras semanas.   Verifique que el beb se encuentra en la posicin correcta mientras lo alimenta.   Use un sostn que soporte bien sus mamas y evite los que tienen aro.   Consuma una dieta balanceada y beba lquidos en cantidad.   Descanse con frecuencia, reljese y tome sus vitaminas prenatales para evitar la fatiga, el estrs y la anemia.  Si sigue estas indicaciones, la congestin debe mejorar en 24 a 48 horas. Si an tiene dificultades, consulte a su asesor en lactancia.  CUDESE USTED MISMA  Cuide sus mamas.   Bese o dchese diariamente.   Evite usar jabn en los pezones.   Comience a amamantar del lado izquierdo en una comida y del lado derecho en la siguiente.   Notar que aumenta el flujo de leche a los 2 a 5 das despus del parto. Puede sentir algunas molestias por la congestin, lo que hace que sus mamas estn duras y sensibles. La congestin disminuye en 24 a 48 horas. Mientras tanto, aplique toallas hmedas calientes durante 5 a 10 minutos antes de amamantar. Un masaje suave y la extraccin de un poco de leche antes de amamantar ablandarn las mamas y har ms fcil que el beb se agarre.   Use un buen sostn y seque al aire los pezones durante 3 a 4 minutos luego de cada  alimentacin.   Solo utilice apsitos de algodn.   Utilice lanolina pura sobre los pezones luego de amamantar. No necesita lavarlos luego de alimentar al nio. Otra opcin es exprimir algunas gotas de leche y masajear suavemente los pezones.  Cumpla con estos cuidados   Consuma alimentos bien balanceados y refrigerios nutritivos.   Beba leche, jugos de fruta y agua para satisfacer la sed (alrededor de 8 vasos por da).   Descanse lo suficiente.  Evite los alimentos que usted note que pueden afectar al beb.  SOLICITE ATENCIN MDICA SI:   Tiene dificultad con la lactancia materna y necesita ayuda.   Tiene una zona de color rojo, dura y dolorosa en la mama que se acompaa de fiebre.   El beb est muy somnoliento como para alimentarse bien o tiene problemas para dormir.   Su beb moja menos de 6 paales al da, a los 5 das de vida.   La piel del beb o la parte blanca de sus ojos est ms amarilla de lo que estaba en el hospital.   Se siente deprimida.  Document Released: 03/01/2005 Document Revised: 08/31/2011 [employer] Patient Information 2013 [employer].  

## 2011-12-29 NOTE — Progress Notes (Signed)
GC and GBS done today, Declines cervical exam. Rev'd precautions. Follow up 1 week.

## 2011-12-29 NOTE — Progress Notes (Signed)
Subjective:    Monica Bryan 21 y.o. G1P0 [redacted]w[redacted]d  Follow-up prenatal: Patient doing well with no concerns today. She had some lower pelvic pain yesterday. She reports +fetal movement. No vaginal bleeding, no leaking/gush of fluid.      Patient to follow-up in 1 week.

## 2011-12-30 LAB — GC/CHLAMYDIA PROBE AMP, GENITAL
Chlamydia, DNA Probe: NEGATIVE
GC Probe Amp, Genital: NEGATIVE

## 2012-01-05 ENCOUNTER — Ambulatory Visit (INDEPENDENT_AMBULATORY_CARE_PROVIDER_SITE_OTHER): Payer: Self-pay | Admitting: Family Medicine

## 2012-01-05 VITALS — BP 128/79 | Temp 97.3°F | Wt 133.5 lb

## 2012-01-05 DIAGNOSIS — O0930 Supervision of pregnancy with insufficient antenatal care, unspecified trimester: Secondary | ICD-10-CM

## 2012-01-05 DIAGNOSIS — A749 Chlamydial infection, unspecified: Secondary | ICD-10-CM

## 2012-01-05 DIAGNOSIS — Z23 Encounter for immunization: Secondary | ICD-10-CM

## 2012-01-05 DIAGNOSIS — Z3400 Encounter for supervision of normal first pregnancy, unspecified trimester: Secondary | ICD-10-CM

## 2012-01-05 DIAGNOSIS — A568 Sexually transmitted chlamydial infection of other sites: Secondary | ICD-10-CM

## 2012-01-05 DIAGNOSIS — O98319 Other infections with a predominantly sexual mode of transmission complicating pregnancy, unspecified trimester: Secondary | ICD-10-CM

## 2012-01-05 LAB — POCT URINALYSIS DIP (DEVICE)
Hgb urine dipstick: NEGATIVE
Nitrite: NEGATIVE
Protein, ur: NEGATIVE mg/dL
Specific Gravity, Urine: 1.02 (ref 1.005–1.030)
Urobilinogen, UA: 0.2 mg/dL (ref 0.0–1.0)

## 2012-01-05 MED ORDER — TETANUS-DIPHTH-ACELL PERTUSSIS 5-2.5-18.5 LF-MCG/0.5 IM SUSP: 0.5000 mL | Freq: Once | INTRAMUSCULAR | Status: DC

## 2012-01-05 NOTE — Progress Notes (Signed)
Pulse: 97

## 2012-01-05 NOTE — Patient Instructions (Addendum)
Embarazo  Tercer trimestre  (Pregnancy - Third Trimester) El tercer trimestre del embarazo (los ltimos 3 meses) es el perodo en el cual tanto usted como su beb crecen con ms rapidez. El beb alcanza un largo de aproximadamente 50 cm. y pesa entre 2,700 y 4,500 kg. El beb gana ms tejido graso y est listo para la vida fuera del cuerpo de la madre. Mientras estn en el interior, los bebs tienen perodos de sueo y vigilia, succionan el pulgar y tienen hipo. Quizs sienta pequeas contracciones del tero. Este es el falso trabajo de parto. Tambin se las conoce como contracciones de Braxton-Hicks . Es como una prctica del parto. Los problemas ms habituales de esta etapa del embarazo incluyen mayor dificultad para respirar, hinchazn de las manos y los pies por retencin de lquidos y la necesidad de orinar con ms frecuencia debido a que el tero y el beb presionan sobre la vejiga.  EXAMENES PRENATALES   Durante los exmenes prenatales, deber seguir realizndose anlisis de sangre. Estas pruebas se realizan para controlar su salud y la del beb. Los anlisis de sangre se realizan para conocer los niveles de algunos compuestos de la sangre (hemoglobina). La anemia (bajo nivel de hemoglobina) es frecuente durante el embarazo. Para prevenirla, se administran hierro y vitaminas. Tambin le tomarn nuevas anlisis para descartar diabetes. Podrn repetirle algunas de las pruebas que le hicieron previamente.  En cada visita le medirn el tamao del tero. Esto permite asegurar que el beb se desarrolla adecuadamente, segn la fecha del embarazo.  Le controlarn la presin arterial en cada visita prenatal. Esto es para asegurarse de que no sufre toxemia.  Le harn un anlisis de orina en cada visita prenatal, para descartar infecciones, diabetes y la presencia de protenas.  Tambin en cada visita controlarn su peso. Esto se realiza para asegurarse que aumenta de peso al ritmo indicado y que usted y su  beb evolucionan normalmente.  En algunas ocasiones se realiza una prueba de ultrasonido para confirmar el correcto desarrollo y evolucin del beb. Esta prueba se realiza con ondas sonoras inofensivas para el beb, de modo que el profesional pueda calcular ms precisamente la fecha del parto.  Analice con su mdico los analgsicos y la anestesia que recibir durante el trabajo de parto y el parto.  Comente la posibilidad de que necesite una cesrea y qu anestesia se recibir.  Informe a su mdico si sufre violencia familiar mental o fsica. A veces, se indica la prueba especializada sin estrs, la prueba de tolerancia a las contracciones y el perfil biofsico para asegurarse de que el beb no tiene problemas. El estudio del lquido amnitico que rodea al beb se llama amniocentesis. El lquido amnitico se obtiene introduciendo una aguja en el vientre (abdomen ). En ocasiones se lleva a cabo cerca del final del embarazo, si es necesario inducir a un parto. En este caso se realiza para asegurarse que los pulmones del beb estn lo suficientemente maduros como para que pueda vivir fuera del tero. Si los pulmones no han madurado y es peligroso que el beb nazca, se administrar a la madre una inyeccin de cortisona , 1 a 2 das antes del parto. . Esto ayuda a que los pulmones del beb maduren y sea ms seguro su nacimiento.  CAMBIOS QUE OCURREN EN EL TERCER TRIMESTRE DEL EMBARAZO  Su organismo atravesar numerosos cambios durante el embarazo. Estos pueden variar de una persona a otra. Converse con el profesional que la asiste acerca los cambios que   usted note y que la preocupen.   Durante el ltimo trimestre probablemente sienta un aumento del apetito. Es normal tener "antojos" de ciertas comidas. Esto vara de una persona a otra y de un embarazo a otro.  Podrn aparecer las primeras estras en las caderas, abdomen y mamas. Estos son cambios normales del cuerpo durante el embarazo. No existen  medicamentos ni ejercicios que puedan prevenir estos cambios.  La constipacin puede tratarse con un laxante o agregando fibra a su dieta. Beber grandes cantidades de lquidos, tomar fibras en forma de vegetales, frutas y granos integrales es de gran ayuda.  Tambin es beneficioso practicar actividad fsica. Si ha sido una persona activa hasta el embarazo, podr continuar con la mayora de las actividades durante el mismo. Si ha sido menos activa, puede ser beneficioso que comience con un programa de ejercicios, como realizar caminatas. Consulte con el profesional que la asiste antes de comenzar un programa de ejercicios.  Evite el consumo de cigarrillos, el alcohol, los medicamentos no recetados y las "drogas de la calle" durante el embarazo. Estas sustancias qumicas afectan la formacin y el desarrollo del beb. Evite estas sustancias durante todo el embarazo para asegurar el nacimiento de un beb sano.  Podr sentir dolor de espalda, tener vrices en las venas y hemorroides, o si ya los sufra, pueden empeorar.  Durante el tercer trimestre se cansar con ms facilidad, lo cual es normal.  Los movimientos del beb pueden ser ms fuertes y con ms frecuencia.  Puede que note dificultades para respirar normalmente.  El ombligo puede salir hacia afuera.  A veces sale una secrecin amarilla de las mamas, que se llama calostro.  Podr aparecer una secrecin mucosa con sangre. Esto suele ocurrir entre unos pocos das y una semana antes del parto. INSTRUCCIONES PARA EL CUIDADO EN EL HOGAR   Cumpla con las citas de control. Siga las indicaciones del mdico con respecto al uso de medicamentos, los ejercicios y la dieta.  Durante el embarazo debe obtener nutrientes para usted y para su beb. Consuma alimentos balanceados a intervalos regulares. Elija alimentos como carne, pescado, leche y otros productos lcteos descremados, vegetales, frutas, panes integrales y cereales. El mdico le informar  cul es el aumento de peso ideal.  Las relaciones sexuales pueden continuarse hasta casi el final del embarazo, si no se presentan otros problemas como prdida prematura (antes de tiempo) de lquido amnitico, hemorragia vaginal o dolor en el vientre (abdominal).  Realice actividad fsica todos los das, si no tiene restricciones. Consulte con el profesional que la asiste si no sabe con certeza si determinados ejercicios son seguros. El mayor aumento de peso se producir en los ltimos 2 trimestres del embarazo. El ejercicio ayuda a:  Controlar su peso.  Mantenerse en forma para el trabajo de parto y el parto .  Perder peso despus del parto.  Haga reposo con frecuencia, con las piernas elevadas, o segn lo necesite para evitar los calambres y el dolor de cintura.  Use un buen sostn o como los que se usan para hacer deportes para aliviar la sensibilidad de las mamas. Tambin puede serle til si lo usa mientras duerme. Si pierde calostro, podr utilizar apsitos en el sostn.  No utilice la baera con agua caliente, baos turcos y saunas.  Colquese el cinturn de seguridad cuando conduzca. Este la proteger a usted y al beb en caso de accidente.  Evite comer carne cruda y el contacto con los utensilios y desperdicios de los gatos. Estos elementos   contienen grmenes que pueden causar defectos de nacimiento en el beb.  Es fcil perder algo de orina durante el embarazo. Apretar y fortalecer los msculos de la pelvis la ayudar con este problema. Practique detener la miccin cuando est en el bao. Estos son los mismos msculos que necesita fortalecer. Son tambin los mismos msculos que utiliza cuando trata de evitar despedir gases. Puede practicar apretando estos msculos diez veces, y repetir esto tres veces por da aproximadamente. Una vez que conozca qu msculos debe apretar, no realice estos ejercicios durante la miccin. Puede favorecerle una infeccin si la orina vuelve hacia  atrs.  Pida ayuda si tienen necesidades financieras, teraputicas o nutricionales. El profesional podr ayudarla con respecto a estas necesidades, o derivarla a otros especialistas.  Haga una lista de nmeros telefnicos de emergencia y tngalos disponibles.  Planifique como obtener ayuda de familiares o amigos cuando regrese a casa desde el hospital.  Hacer un ensayo sobre la partida al hospital.  Tome clases prenatales con el padre para entender, practicar y hacer preguntas sobre el trabajo de parto y el alumbramiento.  Preparar la habitacin del beb / busque una guardera.  No viaje fuera de la ciudad a menos que sea absolutamente necesario y con el asesoramiento de su mdico.  Use slo zapatos de tacn bajo o sin tacn para tener mejor equilibrio y evitar cadas. USO DE MEDICAMENTOS Y CONSUMO DE DROGAS DURANTE EL EMBARAZO   Tome las vitaminas apropiadas para esta etapa tal como se le indic. Las vitaminas deben contener un miligramo de cido flico. Guarde todas las vitaminas fuera del alcance de los nios. La ingestin de slo un par de vitaminas o tabletas que contengan hierro pueden ocasionar la muerte en un beb o en un nio pequeo.  Evite el uso de todos los medicamentos, incluyendo hierbas, medicamentos de venta libre, sin receta o que no hayan sido sugeridos por su mdico. Slo tome medicamentos de venta libre o medicamentos recetados para el dolor, el malestar o fiebre como lo indique su mdico. No tome aspirina, ibuprofeno (Motrin, Advil, Nuprin) o naproxeno (Aleve) excepto que su mdico se lo indique.  Infrmele al profesional si consume alguna droga.  El alcohol se relaciona con ciertos defectos congnitos. Incluye el sndrome de alcoholismo fetal. Debe evitar absolutamente el consumo de alcohol, en cualquier forma. El fumar produce baja tasa de natalidad y bebs prematuros.  Las drogas ilegales o de la calle son muy perjudiciales para el beb. Estn absolutamente  prohibidas. Un beb que nace de una madre adicta, ser adicto al nacer. Ese beb tendr los mismos sntomas de abstinencia que un adulto. SOLICITE ATENCIN MDICA SI:  Tiene preguntas o preocupaciones relacionadas con el embarazo. Es mejor que llame para formular las preguntas si no puede esperar hasta la prxima visita, que sentirse preocupada por ellas.  DECISIONES ACERCA DE LA CIRCUNCISIN  Usted puede saber o no cul es el sexo de su beb. Si ya sabe que ser un varn, este es el momento de pensar acerca de la circuncisin. La circuncisin es la extirpacin del prepucio. Esta es la piel que cubre el extremo sensible del pene. No hay un motivo mdico que lo justifique. Generalmente la decisin se toma segn lo que sea popular en ese momento, o segn creencias religiosas. Podr conversar estos temas con su mdico o con el pediatra.  SOLICITE ATENCIN MDICA DE INMEDIATO SI:   La temperatura oral le sube a ms de 102 F (38.9 C) o lo que su mdico le   indique.  Tiene una prdida de lquido por la vagina (canal de parto). Si sospecha una ruptura de las membranas, tmese la temperatura y llame al profesional para informarlo sobre esto.  Observa unas pequeas manchas, una hemorragia vaginal o elimina cogulos. Notifique al profesional acerca de la cantidad y de cuntos apsitos est utilizando.  Presenta un olor desagradable en la secrecin vaginal y observa un cambio en el color, de transparente a blanco.  Ha vomitado durante ms de 24 horas.  Siente escalofros o le sube la fiebre.  Le falta el aire.  Siente ardor al orinar.  Baja o sube ms de 2 libras (900 g), o segn lo indicado por el profesional que la asiste.  Observa que sbitamente se le hinchan el rostro, las manos, los pies o las piernas.  Siente dolor en el vientre (abdominal). Las molestias en el ligamento redondo son una causa benigna frecuente de dolor abdominal durante el embarazo. El profesional que la asiste deber  evaluarla.  Presenta dolor de cabeza intenso que no se alivia.  Tiene problemas visuales, visin doble o borrosa.  Si no siente los movimientos del beb durante ms de 1 hora. Si piensa que el beb no se mueve tanto como lo haca habitualmente, coma algo que contenga azcar y recustese sobre el lado izquierdo durante una hora. El beb debe moverse al menos 4  5 veces por hora. Comunquese inmediatamente si el beb se mueve menos que lo indicado.  Se cae, se ve involucrada en un accidente automovilstico o sufre algn tipo de traumatismo.  En su hogar hay violencia mental o fsica. Document Released: 12/09/2004 Document Revised: 08/31/2011 [employer] Patient Information 2013 [employer].  

## 2012-01-05 NOTE — Progress Notes (Signed)
No complaints. Sono 10/11 showed EFW >90% but fundal height is on low side for dates. 1 hour GTT 104. Other IOB labs negative. F/U 1 week. GBS negative.

## 2012-01-12 ENCOUNTER — Ambulatory Visit (INDEPENDENT_AMBULATORY_CARE_PROVIDER_SITE_OTHER): Payer: Self-pay | Admitting: Advanced Practice Midwife

## 2012-01-12 VITALS — BP 123/82 | Temp 97.7°F | Wt 134.5 lb

## 2012-01-12 DIAGNOSIS — O98319 Other infections with a predominantly sexual mode of transmission complicating pregnancy, unspecified trimester: Secondary | ICD-10-CM

## 2012-01-12 DIAGNOSIS — O0930 Supervision of pregnancy with insufficient antenatal care, unspecified trimester: Secondary | ICD-10-CM

## 2012-01-12 DIAGNOSIS — Z3400 Encounter for supervision of normal first pregnancy, unspecified trimester: Secondary | ICD-10-CM

## 2012-01-12 LAB — POCT URINALYSIS DIP (DEVICE)
Hgb urine dipstick: NEGATIVE
Nitrite: NEGATIVE
Protein, ur: NEGATIVE mg/dL
Specific Gravity, Urine: 1.025 (ref 1.005–1.030)
Urobilinogen, UA: 1 mg/dL (ref 0.0–1.0)

## 2012-01-12 NOTE — Progress Notes (Signed)
Doing well.  Good fetal movement, denies vaginal bleeding, LOF, regular contractions.  Reviewed signs of labor.  

## 2012-01-12 NOTE — Addendum Note (Signed)
Addended by: Sharen Counter A on: 01/12/2012 12:37 PM   Modules accepted: Level of Service

## 2012-01-12 NOTE — Progress Notes (Signed)
Pulse- 93  Pain- lower abd

## 2012-01-15 ENCOUNTER — Encounter (HOSPITAL_COMMUNITY): Payer: Self-pay | Admitting: *Deleted

## 2012-01-15 ENCOUNTER — Inpatient Hospital Stay (HOSPITAL_COMMUNITY)
Admission: AD | Admit: 2012-01-15 | Discharge: 2012-01-17 | DRG: 775 | Disposition: A | Discharge status: Home / Self Care | Payer: Medicaid Other | Source: Ambulatory Visit | Attending: Family Medicine | Admitting: Family Medicine

## 2012-01-15 ENCOUNTER — Encounter (HOSPITAL_COMMUNITY): Payer: Self-pay | Admitting: Anesthesiology

## 2012-01-15 ENCOUNTER — Inpatient Hospital Stay (HOSPITAL_COMMUNITY): Payer: Medicaid Other | Admitting: Anesthesiology

## 2012-01-15 DIAGNOSIS — O4290 Premature rupture of membranes, unspecified as to length of time between rupture and onset of labor, unspecified weeks of gestation: Secondary | ICD-10-CM

## 2012-01-15 LAB — CBC
HCT: 40.5 % (ref 36.0–46.0)
Hemoglobin: 13.7 g/dL (ref 12.0–15.0)
MCH: 31.1 pg (ref 26.0–34.0)
MCHC: 33.8 g/dL (ref 30.0–36.0)
MCV: 91.8 fL (ref 78.0–100.0)

## 2012-01-15 LAB — URINALYSIS, ROUTINE W REFLEX MICROSCOPIC
Bilirubin Urine: NEGATIVE
Ketones, ur: 15 mg/dL — AB
Nitrite: NEGATIVE
Protein, ur: NEGATIVE mg/dL
Specific Gravity, Urine: 1.01 (ref 1.005–1.030)
Urobilinogen, UA: 0.2 mg/dL (ref 0.0–1.0)

## 2012-01-15 LAB — URINE MICROSCOPIC-ADD ON

## 2012-01-15 LAB — ABO/RH: ABO/RH(D): O POS

## 2012-01-15 LAB — TYPE AND SCREEN: Antibody Screen: NEGATIVE

## 2012-01-15 MED ORDER — FENTANYL CITRATE 0.05 MG/ML IJ SOLN
50.0000 ug | INTRAMUSCULAR | Status: DC | PRN
  Administered 2012-01-15 (×2): 50 ug via INTRAVENOUS
  Filled 2012-01-15 (×2): qty 2

## 2012-01-15 MED ORDER — TERBUTALINE SULFATE 1 MG/ML IJ SOLN: 0.2500 mg | Freq: Once | INTRAMUSCULAR | Status: AC | PRN

## 2012-01-15 MED ORDER — OXYTOCIN 40 UNITS IN LACTATED RINGERS INFUSION - SIMPLE MED: 62.5000 mL/h | INTRAVENOUS | Status: DC

## 2012-01-15 MED ORDER — EPHEDRINE 5 MG/ML INJ
10.0000 mg | INTRAVENOUS | Status: DC | PRN
  Filled 2012-01-15: qty 4

## 2012-01-15 MED ORDER — MISOPROSTOL 25 MCG QUARTER TABLET: 25.0000 ug | ORAL_TABLET | ORAL | Status: DC | PRN

## 2012-01-15 MED ORDER — IBUPROFEN 600 MG PO TABS: 600.0000 mg | ORAL_TABLET | Freq: Four times a day (QID) | ORAL | Status: DC | PRN

## 2012-01-15 MED ORDER — LIDOCAINE HCL (PF) 1 % IJ SOLN
INTRAMUSCULAR | Status: DC | PRN
  Administered 2012-01-15 (×2): 4 mL

## 2012-01-15 MED ORDER — ONDANSETRON HCL 4 MG/2ML IJ SOLN
4.0000 mg | Freq: Four times a day (QID) | INTRAMUSCULAR | Status: DC | PRN
  Administered 2012-01-15: 4 mg via INTRAVENOUS
  Filled 2012-01-15: qty 2

## 2012-01-15 MED ORDER — LACTATED RINGERS IV SOLN: 500.0000 mL | INTRAVENOUS | Status: DC | PRN

## 2012-01-15 MED ORDER — LACTATED RINGERS IV SOLN
INTRAVENOUS | Status: DC
  Administered 2012-01-15 (×2): via INTRAVENOUS

## 2012-01-15 MED ORDER — LIDOCAINE HCL (PF) 1 % IJ SOLN
30.0000 mL | INTRAMUSCULAR | Status: DC | PRN
  Filled 2012-01-15: qty 30

## 2012-01-15 MED ORDER — HYDROXYZINE HCL 50 MG/ML IM SOLN: 50.0000 mg | Freq: Four times a day (QID) | INTRAMUSCULAR | Status: DC | PRN

## 2012-01-15 MED ORDER — LACTATED RINGERS IV SOLN: 500.0000 mL | Freq: Once | INTRAVENOUS | Status: DC

## 2012-01-15 MED ORDER — OXYTOCIN 40 UNITS IN LACTATED RINGERS INFUSION - SIMPLE MED
1.0000 m[IU]/min | INTRAVENOUS | Status: DC
  Administered 2012-01-15: 2 m[IU]/min via INTRAVENOUS
  Filled 2012-01-15: qty 1000

## 2012-01-15 MED ORDER — EPHEDRINE 5 MG/ML INJ: 10.0000 mg | INTRAVENOUS | Status: DC | PRN

## 2012-01-15 MED ORDER — PHENYLEPHRINE 40 MCG/ML (10ML) SYRINGE FOR IV PUSH (FOR BLOOD PRESSURE SUPPORT): 80.0000 ug | PREFILLED_SYRINGE | INTRAVENOUS | Status: DC | PRN

## 2012-01-15 MED ORDER — CITRIC ACID-SODIUM CITRATE 334-500 MG/5ML PO SOLN: 30.0000 mL | ORAL | Status: DC | PRN

## 2012-01-15 MED ORDER — OXYCODONE-ACETAMINOPHEN 5-325 MG PO TABS: 1.0000 | ORAL_TABLET | ORAL | Status: DC | PRN

## 2012-01-15 MED ORDER — FENTANYL 2.5 MCG/ML BUPIVACAINE 1/10 % EPIDURAL INFUSION (WH - ANES)
14.0000 mL/h | INTRAMUSCULAR | Status: DC
  Filled 2012-01-15: qty 125

## 2012-01-15 MED ORDER — FLEET ENEMA 7-19 GM/118ML RE ENEM: 1.0000 | ENEMA | RECTAL | Status: DC | PRN

## 2012-01-15 MED ORDER — TERBUTALINE SULFATE 1 MG/ML IJ SOLN: 0.2500 mg | Freq: Once | INTRAMUSCULAR | Status: DC | PRN

## 2012-01-15 MED ORDER — ACETAMINOPHEN 325 MG PO TABS
650.0000 mg | ORAL_TABLET | ORAL | Status: DC | PRN
  Administered 2012-01-16: 650 mg via ORAL
  Filled 2012-01-15: qty 2

## 2012-01-15 MED ORDER — NALBUPHINE SYRINGE 5 MG/0.5 ML
5.0000 mg | INJECTION | INTRAMUSCULAR | Status: DC | PRN
  Administered 2012-01-15: 5 mg via INTRAVENOUS
  Filled 2012-01-15: qty 0.5

## 2012-01-15 MED ORDER — FENTANYL 2.5 MCG/ML BUPIVACAINE 1/10 % EPIDURAL INFUSION (WH - ANES)
INTRAMUSCULAR | Status: DC | PRN
  Administered 2012-01-15: 13 mL/h via EPIDURAL

## 2012-01-15 MED ORDER — HYDROXYZINE HCL 50 MG PO TABS
50.0000 mg | ORAL_TABLET | Freq: Four times a day (QID) | ORAL | Status: DC | PRN
  Administered 2012-01-15: 50 mg via ORAL
  Filled 2012-01-15: qty 1

## 2012-01-15 MED ORDER — DIPHENHYDRAMINE HCL 50 MG/ML IJ SOLN: 12.5000 mg | INTRAMUSCULAR | Status: DC | PRN

## 2012-01-15 MED ORDER — OXYTOCIN BOLUS FROM INFUSION: 500.0000 mL | INTRAVENOUS | Status: DC

## 2012-01-15 MED ORDER — PHENYLEPHRINE 40 MCG/ML (10ML) SYRINGE FOR IV PUSH (FOR BLOOD PRESSURE SUPPORT)
80.0000 ug | PREFILLED_SYRINGE | INTRAVENOUS | Status: DC | PRN
  Filled 2012-01-15: qty 5

## 2012-01-15 NOTE — H&P (Signed)
Monica Bryan Georgana Curio is a 21 y.o. G1P0 female at [redacted]w[redacted]d by LMP presenting for report of uc's and bright red vaginal bleeding 'like a period' w/ q void since 0600.  Reports good FM, denies LOF.  Late onset of care @ 34.5wks. Anatomy u/s @ 35wks wnl, EFW 3174gm >90%, w/ EDB of 11/5 based on u/s.  1hr glucola 104. Last appt at [facility] was on Wed.  Was treated for chlamydia during pregnancy w/ neg poc. Maternal Medical History:  Reason for admission: Reason for admission: rupture of membranes.  Reason for Admission:   nauseaContractions: Onset was 6-12 hours ago.   Frequency: irregular.   Perceived severity is moderate.    Fetal activity: Perceived fetal activity is normal.   Last perceived fetal movement was within the past hour.    Prenatal complications: H/o treated chlamydia during pregnancy w/ neg poc Late onset of care @ 34.5wks  Prenatal Complications - Diabetes: none.    OB History    Grav Para Term Preterm Abortions TAB SAB Ect Mult Living   1              Past Medical History  Diagnosis Date  . No pertinent past medical history    Past Surgical History  Procedure Date  . No past surgeries    Family History: family history is negative for Other. Social History:  reports that she has never smoked. She has never used smokeless tobacco. She reports that she does not drink alcohol or use illicit drugs.   Prenatal Transfer Tool  Maternal Diabetes: No Genetic Screening: Declined Maternal Ultrasounds/Referrals: Normal EFW >90% Fetal Ultrasounds or other Referrals:  None Maternal Substance Abuse:  No Significant Maternal Medications:  None Significant Maternal Lab Results:  Lab values include: Group B Strep negative Other Comments:  Late onset of care @ 34.5wks  Review of Systems  Constitutional: Negative.   HENT: Negative.   Eyes: Negative.   Respiratory: Negative.   Cardiovascular: Negative.   Gastrointestinal: Positive for vomiting (x1 in mau) and abdominal pain  (intermittent lower abdominal pain ). Negative for nausea, diarrhea and constipation.  Genitourinary: Positive for frequency. Negative for dysuria.       Bright red bleeding 'like a period' when urinating- began at 0600   Musculoskeletal: Positive for back pain (r/t uc's).  Skin: Negative.   Neurological: Negative.   Endo/Heme/Allergies: Negative.   Psychiatric/Behavioral: Negative.     Dilation: 1 Effacement (%): 80 Station: -2;-3 Exam by:: Joellyn Haff, CNM Blood pressure 123/61, pulse 106, temperature 98 F (36.7 C), temperature source Oral, resp. rate 18, height 5\' 1"  (1.549 m), weight 62.143 kg (137 lb), last menstrual period 04/23/2011. Maternal Exam:  Uterine Assessment: Contraction strength is mild.  Contraction frequency is irregular.   Abdomen: Patient reports the following abdominal tenderness: suprapubic.  Fetal presentation: vertex  Introitus: Normal vulva. Normal vagina.  Ferning test: positive.  Nitrazine test: not done. Amniotic fluid character: clear.  Pelvis: adequate for delivery.   Cervix: Cervix evaluated by sterile speculum exam and digital exam.     Fetal Exam Fetal Monitor Review: Mode: ultrasound.   Baseline rate: 145.  Variability: moderate (6-25 bpm).   Pattern: no decelerations and accelerations present.    Fetal State Assessment: Category I - tracings are normal.    UCs: q 2-5mins, mild  Physical Exam  Constitutional: She is oriented to person, place, and time. She appears well-developed.  HENT:  Head: Normocephalic.  Neck: Normal range of motion.  Cardiovascular: Normal  rate and regular rhythm.   Respiratory: Effort normal and breath sounds normal.  GI: Soft. There is tenderness (suprapubic to palpation) in the suprapubic area.       gravid  Genitourinary: Vagina normal and uterus normal.       Spec exam: cervix visually closed, no active bleeding.  Pooling of clear fluid noted. Fern +  SVE: 1/80/-2 vtx  Musculoskeletal: Normal range  of motion.  Neurological: She is alert and oriented to person, place, and time. She has normal reflexes.  Skin: Skin is warm and dry.  Psychiatric: She has a normal mood and affect. Her behavior is normal. Judgment and thought content normal.    Prenatal labs: ABO, Rh: O/POS/-- (10/09 0947) Antibody: NEG (10/09 0947) Rubella: 21.8 (10/09 0947) RPR: NON REAC (10/09 0947)  HBsAg: NEGATIVE (10/09 0947)  HIV: NON REACTIVE (10/09 0947)  GBS: Negative (10/16 0000)   Assessment/Plan: A:  [redacted]w[redacted]d SIUP  PROM-unknown duration  Cat I FHR  Late onset pnc  GBS neg  EFW >90% @ 35wks  P:   Admit to BS  Painful uc's q 2-27mins- will bypass cytotec and begin pitocin per protocol   IV pain meds at request, epidural when in active labor at request  Anticipate NSVD  Send UA to r/o UTI   Marge Duncans 01/15/2012, 1:14 PM

## 2012-01-15 NOTE — Anesthesia Procedure Notes (Signed)
Epidural Patient location during procedure: OB Start time: 01/15/2012 8:35 PM  Staffing Anesthesiologist: Birt Reinoso A. Performed by: anesthesiologist   Preanesthetic Checklist Completed: patient identified, site marked, surgical consent, pre-op evaluation, timeout performed, IV checked, risks and benefits discussed and monitors and equipment checked  Epidural Patient position: sitting Prep: site prepped and draped and DuraPrep Patient monitoring: continuous pulse ox and blood pressure Approach: midline Injection technique: LOR air  Needle:  Needle type: Tuohy  Needle gauge: 17 G Needle length: 9 cm and 9 Needle insertion depth: 4 cm Catheter type: closed end flexible Catheter size: 19 Gauge Catheter at skin depth: 9 cm Test dose: negative and Other  Assessment Events: blood not aspirated, injection not painful, no injection resistance, negative IV test and no paresthesia  Additional Notes Patient identified. Risks and benefits discussed including failed block, incomplete  Pain control, post dural puncture headache, nerve damage, paralysis, blood pressure Changes, nausea, vomiting, reactions to medications-both toxic and allergic and post Partum back pain. All questions were answered. Patient expressed understanding and wished to proceed. Sterile technique was used throughout procedure. Epidural site was Dressed with sterile barrier dressing. No paresthesias, signs of intravascular injection Or signs of intrathecal spread were encountered.  Patient was more comfortable after the epidural was dosed. Spanish interpreter present throughout procedure. Please see RN's note for documentation of vital signs and FHR which are stable.

## 2012-01-15 NOTE — H&P (Signed)
Chart reviewed and agree with management and plan.  

## 2012-01-15 NOTE — MAU Note (Signed)
"  I have been having contractions since 0600.  I have been bleeding like a period everytime I go to the BR.  No LOF.  (+) FM."

## 2012-01-15 NOTE — Progress Notes (Signed)
Monica Bryan is a 21 y.o. G1P0 at [redacted]w[redacted]d admitted for PROM of unknown duration and early labor  Subjective: Uncomfortable, breathing with uc's. Received IV nubain about an hour ago, states helped w/ pain, but now returning. Family supportive at bs.  Objective: BP 120/76  Pulse 80  Temp 98.3 F (36.8 C) (Oral)  Resp 18  Ht 5\' 1"  (1.549 m)  Wt 62.143 kg (137 lb)  BMI 25.89 kg/m2  LMP 04/23/2011      FHT:  FHR: 135 bpm, variability: moderate,  accelerations:  Present,  decelerations:  Absent UC:   irregular, every 1.5-4 minutes SVE:   1.5/80/-2, vtx, ?forebag felt Cervical foley bulb inserted and inflated w/ 60ml LR w/o difficulty   Labs: Lab Results  Component Value Date   WBC 10.2 01/15/2012   HGB 13.7 01/15/2012   HCT 40.5 01/15/2012   MCV 91.8 01/15/2012   PLT 143* 01/15/2012    Assessment / Plan: Induction of labor d/t prom, currently on pitocin @ 38mu/min, cervical foley bulb now in place. RN to keep pitocin @ 74mu/min until foley bulb falls out  Labor: cervical ripening phase Preeclampsia:  n/a Fetal Wellbeing:  Category I Pain Control:  IV pain meds, not yet time for another dose of nubain, pt requesting more meds- will order fentanyl and d/c nubain I/D:  n/a Anticipated MOD:  NSVD  Marge Duncans 01/15/2012, 6:04 PM

## 2012-01-15 NOTE — Anesthesia Preprocedure Evaluation (Signed)

## 2012-01-15 NOTE — Progress Notes (Signed)
Interpreter bedside

## 2012-01-15 NOTE — Progress Notes (Signed)
Denise Washburn is a 21 y.o. G1P0 at [redacted]w[redacted]d admitted for PROM  Subjective: Comfortable w/ epidural, denies pressure, no complaints.  Family supportive at bs.  Objective: BP 134/97  Pulse 104  Temp 99.4 F (37.4 C) (Axillary)  Resp 18  Ht 5\' 1"  (1.549 m)  Wt 62.143 kg (137 lb)  BMI 25.89 kg/m2  SpO2 99%  LMP 04/23/2011      FHT:  FHR: 140 bpm, variability: moderate,  accelerations:  Present,  decelerations:  Present variables UC:   regular, every 2-4 minutes SVE:   Dilation: 10 Effacement (%): 100 Station: 0;+1 Exam by:: H.Norton, RN   Labs: Lab Results  Component Value Date   WBC 10.2 01/15/2012   HGB 13.7 01/15/2012   HCT 40.5 01/15/2012   MCV 91.8 01/15/2012   PLT 143* 01/15/2012    Assessment / Plan: Induction of labor due to PROM,  progressing well on pitocin, not feeling pressure, will allow to labor down  Labor: Progressing normally Preeclampsia:  n/a Fetal Wellbeing:  Category II Pain Control:  Epidural I/D:  n/a Anticipated MOD:  NSVD  Marge Duncans 01/15/2012, 11:19 PM

## 2012-01-15 NOTE — Progress Notes (Signed)
Monica Bryan is a 21 y.o. G1P0 at [redacted]w[redacted]d admitted for induction of labor due to PROM w/ unknown duration.  Subjective: Foley bulb fell out, very uncomfortable, iv meds not helping, requesting epidural.  Family supportive at bedside.    Objective: BP 117/73  Pulse 93  Temp 98 F (36.7 C) (Oral)  Resp 18  Ht 5\' 1"  (1.549 m)  Wt 62.143 kg (137 lb)  BMI 25.89 kg/m2  SpO2 100%  LMP 04/23/2011      FHT:  FHR: 135 bpm, variability: moderate,  accelerations:  Present,  decelerations:  Present early UC:   regular, every 2-4 minutes SVE:   5.5 by RN after foley bulb fell out  Labs: Lab Results  Component Value Date   WBC 10.2 01/15/2012   HGB 13.7 01/15/2012   HCT 40.5 01/15/2012   MCV 91.8 01/15/2012   PLT 143* 01/15/2012    Assessment / Plan: IOL d/t PROM w/ unknown duration, foley bulb now out, pitocin at 42mu/min.  RN to increase pitocin per protocol as needed once comfortable w/ epidural.  Labor: cervical ripening phase complete Preeclampsia:  n/a Fetal Wellbeing:  Cat I Pain Control:  IV pain meds, desires epidural now I/D:  n/a Anticipated MOD:  NSVD  Marge Duncans 01/15/2012, 8:53 PM

## 2012-01-15 NOTE — MAU Note (Signed)
Pt reports having ctx since 6am. reports some bloody show and good fetal movement.

## 2012-01-16 ENCOUNTER — Encounter (HOSPITAL_COMMUNITY): Payer: Self-pay | Admitting: *Deleted

## 2012-01-16 DIAGNOSIS — O4290 Premature rupture of membranes, unspecified as to length of time between rupture and onset of labor, unspecified weeks of gestation: Secondary | ICD-10-CM

## 2012-01-16 MED ORDER — IBUPROFEN 600 MG PO TABS
600.0000 mg | ORAL_TABLET | Freq: Four times a day (QID) | ORAL | Status: DC
Start: 2012-01-16 — End: 2012-01-17
  Administered 2012-01-16 – 2012-01-17 (×5): 600 mg via ORAL
  Filled 2012-01-16 (×5): qty 1

## 2012-01-16 MED ORDER — BENZOCAINE-MENTHOL 20-0.5 % EX AERO
1.0000 "application " | INHALATION_SPRAY | CUTANEOUS | Status: DC | PRN
  Filled 2012-01-16: qty 56

## 2012-01-16 MED ORDER — SIMETHICONE 80 MG PO CHEW: 80.0000 mg | CHEWABLE_TABLET | ORAL | Status: DC | PRN

## 2012-01-16 MED ORDER — DIBUCAINE 1 % RE OINT: 1.0000 "application " | TOPICAL_OINTMENT | RECTAL | Status: DC | PRN

## 2012-01-16 MED ORDER — PRENATAL MULTIVITAMIN CH
1.0000 | ORAL_TABLET | Freq: Every day | ORAL | Status: DC
  Administered 2012-01-16 – 2012-01-17 (×2): 1 via ORAL
  Filled 2012-01-16 (×2): qty 1

## 2012-01-16 MED ORDER — LANOLIN HYDROUS EX OINT: TOPICAL_OINTMENT | CUTANEOUS | Status: DC | PRN

## 2012-01-16 MED ORDER — ERYTHROMYCIN 5 MG/GM OP OINT
TOPICAL_OINTMENT | OPHTHALMIC | Status: AC
  Filled 2012-01-16: qty 1

## 2012-01-16 MED ORDER — SENNOSIDES-DOCUSATE SODIUM 8.6-50 MG PO TABS: 2.0000 | ORAL_TABLET | Freq: Every day | ORAL | Status: DC

## 2012-01-16 MED ORDER — GENTAMICIN SULFATE 40 MG/ML IJ SOLN: 150.0000 mg | Freq: Once | INTRAVENOUS | Status: DC

## 2012-01-16 MED ORDER — MEASLES, MUMPS & RUBELLA VAC ~~LOC~~ INJ
0.5000 mL | INJECTION | Freq: Once | SUBCUTANEOUS | Status: DC
  Filled 2012-01-16: qty 0.5

## 2012-01-16 MED ORDER — SODIUM CHLORIDE 0.9 % IV SOLN
2.0000 g | Freq: Once | INTRAVENOUS | Status: DC
  Administered 2012-01-16: 2 g via INTRAVENOUS
  Filled 2012-01-16: qty 2000

## 2012-01-16 MED ORDER — ZOLPIDEM TARTRATE 5 MG PO TABS: 5.0000 mg | ORAL_TABLET | Freq: Every evening | ORAL | Status: DC | PRN

## 2012-01-16 MED ORDER — ONDANSETRON HCL 4 MG/2ML IJ SOLN: 4.0000 mg | INTRAMUSCULAR | Status: DC | PRN

## 2012-01-16 MED ORDER — WITCH HAZEL-GLYCERIN EX PADS
1.0000 | MEDICATED_PAD | CUTANEOUS | Status: DC | PRN
Start: 2012-01-16 — End: 2012-01-17

## 2012-01-16 MED ORDER — METHYLERGONOVINE MALEATE 0.2 MG/ML IJ SOLN: 0.2000 mg | INTRAMUSCULAR | Status: DC | PRN

## 2012-01-16 MED ORDER — DIPHENHYDRAMINE HCL 25 MG PO CAPS: 25.0000 mg | ORAL_CAPSULE | Freq: Four times a day (QID) | ORAL | Status: DC | PRN

## 2012-01-16 MED ORDER — METHYLERGONOVINE MALEATE 0.2 MG PO TABS: 0.2000 mg | ORAL_TABLET | ORAL | Status: DC | PRN

## 2012-01-16 MED ORDER — OXYCODONE-ACETAMINOPHEN 5-325 MG PO TABS
1.0000 | ORAL_TABLET | ORAL | Status: DC | PRN
Start: 2012-01-16 — End: 2012-01-17
  Administered 2012-01-16: 1 via ORAL
  Filled 2012-01-16: qty 1

## 2012-01-16 MED ORDER — TETANUS-DIPHTH-ACELL PERTUSSIS 5-2.5-18.5 LF-MCG/0.5 IM SUSP: 0.5000 mL | Freq: Once | INTRAMUSCULAR | Status: DC

## 2012-01-16 MED ORDER — ONDANSETRON HCL 4 MG PO TABS: 4.0000 mg | ORAL_TABLET | ORAL | Status: DC | PRN

## 2012-01-16 NOTE — Anesthesia Postprocedure Evaluation (Signed)
  Anesthesia Post-op Note  Patient: Monica Bryan [facility] GARCIA  Procedure(s) Performed: * No procedures listed *  Patient Location: PACU and Mother/Baby  Anesthesia Type:Epidural  Level of Consciousness: awake, alert  and oriented  Airway and Oxygen Therapy: Patient Spontanous Breathing  Post-op Pain: mild  Post-op Assessment: Patient's Cardiovascular Status Stable, Respiratory Function Stable, No signs of Nausea or vomiting, Adequate PO intake, Pain level controlled, No headache, No backache, No residual numbness and No residual motor weakness  Post-op Vital Signs: stable  Complications: No apparent anesthesia complications

## 2012-01-16 NOTE — Progress Notes (Signed)
IV gentamicin given; MAR would not allow to override scanning after EPIC downtime

## 2012-01-17 LAB — URINE CULTURE
Colony Count: NO GROWTH
Culture: NO GROWTH

## 2012-01-17 NOTE — Progress Notes (Signed)
UR chart review completed.  

## 2012-01-17 NOTE — Discharge Summary (Signed)
Obstetric Discharge Summary Reason for Admission: rupture of membranes Prenatal Procedures: none Intrapartum Procedures: spontaneous vaginal delivery Postpartum Procedures: none Complications-Operative and Postpartum: none Hemoglobin  Date Value Range Status  01/15/2012 13.7  12.0 - 15.0 g/dL Final     HCT  Date Value Range Status  01/15/2012 40.5  36.0 - 46.0 % Final    Physical Exam:  General: alert, cooperative and no distress Lochia: appropriate Uterine Fundus: firm DVT Evaluation: No evidence of DVT seen on physical exam.  Discharge Diagnoses: Term Pregnancy-delivered  Discharge Information: Date: 01/17/2012 Activity: pelvic rest Diet: routine Medications: Ibuprofen OTC Condition: stable Instructions: refer to practice specific booklet Discharge to: home  Patient states she desires a Depo shot and will receive in clinic before going home.  Newborn Data: Live born female  Birth Weight: 7 lb 7.2 oz (3379 g) APGAR: 8, 9  Home with mother.  Monica Bryan 01/17/2012, 7:32 AM I have seen and examined this patient and agree the above assessment. CRESENZO-DISHMAN,[name] 01/17/2012 7:44 AM

## 2012-01-19 ENCOUNTER — Encounter: Payer: Self-pay | Admitting: Family Medicine

## 2012-02-16 ENCOUNTER — Ambulatory Visit: Payer: Self-pay | Admitting: Obstetrics and Gynecology

## 2013-05-17 ENCOUNTER — Telehealth: Payer: Self-pay | Admitting: General Practice

## 2013-05-17 NOTE — Telephone Encounter (Signed)
Called pt to schedule, left message with husband to schedule.

## 2013-05-29 ENCOUNTER — Ambulatory Visit: Payer: Medicaid Other

## 2013-05-31 ENCOUNTER — Ambulatory Visit: Payer: Medicaid Other

## 2014-01-14 ENCOUNTER — Encounter (HOSPITAL_COMMUNITY): Payer: Self-pay | Admitting: *Deleted

## 2014-01-25 ENCOUNTER — Ambulatory Visit: Payer: Self-pay | Attending: Family Medicine

## 2014-02-15 ENCOUNTER — Ambulatory Visit (HOSPITAL_BASED_OUTPATIENT_CLINIC_OR_DEPARTMENT_OTHER): Payer: Self-pay

## 2014-02-15 ENCOUNTER — Encounter: Payer: Self-pay | Admitting: Internal Medicine

## 2014-02-15 ENCOUNTER — Ambulatory Visit: Payer: Self-pay | Attending: Internal Medicine | Admitting: Internal Medicine

## 2014-02-15 VITALS — BP 108/72 | HR 90 | Temp 98.0°F | Resp 16 | Wt 94.0 lb

## 2014-02-15 DIAGNOSIS — K219 Gastro-esophageal reflux disease without esophagitis: Secondary | ICD-10-CM | POA: Insufficient documentation

## 2014-02-15 DIAGNOSIS — Z833 Family history of diabetes mellitus: Secondary | ICD-10-CM | POA: Insufficient documentation

## 2014-02-15 DIAGNOSIS — Z23 Encounter for immunization: Secondary | ICD-10-CM

## 2014-02-15 DIAGNOSIS — R51 Headache: Secondary | ICD-10-CM | POA: Insufficient documentation

## 2014-02-15 DIAGNOSIS — R519 Headache, unspecified: Secondary | ICD-10-CM | POA: Insufficient documentation

## 2014-02-15 DIAGNOSIS — R634 Abnormal weight loss: Secondary | ICD-10-CM | POA: Insufficient documentation

## 2014-02-15 HISTORY — DX: Family history of diabetes mellitus: Z83.3

## 2014-02-15 LAB — CBC WITH DIFFERENTIAL/PLATELET
BASOS ABS: 0 10*3/uL (ref 0.0–0.1)
Basophils Relative: 0 % (ref 0–1)
EOS PCT: 2 % (ref 0–5)
Eosinophils Absolute: 0.2 10*3/uL (ref 0.0–0.7)
HCT: 36.6 % (ref 36.0–46.0)
HEMOGLOBIN: 13 g/dL (ref 12.0–15.0)
LYMPHS ABS: 2.6 10*3/uL (ref 0.7–4.0)
LYMPHS PCT: 27 % (ref 12–46)
MCH: 30.4 pg (ref 26.0–34.0)
MCHC: 35.5 g/dL (ref 30.0–36.0)
MCV: 85.5 fL (ref 78.0–100.0)
MPV: 10 fL (ref 9.4–12.4)
Monocytes Absolute: 0.8 10*3/uL (ref 0.1–1.0)
Monocytes Relative: 8 % (ref 3–12)
NEUTROS ABS: 6 10*3/uL (ref 1.7–7.7)
NEUTROS PCT: 63 % (ref 43–77)
PLATELETS: 237 10*3/uL (ref 150–400)
RBC: 4.28 MIL/uL (ref 3.87–5.11)
RDW: 12.9 % (ref 11.5–15.5)
WBC: 9.6 10*3/uL (ref 4.0–10.5)

## 2014-02-15 LAB — COMPLETE METABOLIC PANEL WITH GFR
ALT: 34 U/L (ref 0–35)
AST: 28 U/L (ref 0–37)
Albumin: 4.2 g/dL (ref 3.5–5.2)
Alkaline Phosphatase: 59 U/L (ref 39–117)
BUN: 16 mg/dL (ref 6–23)
CALCIUM: 9.8 mg/dL (ref 8.4–10.5)
CHLORIDE: 105 meq/L (ref 96–112)
CO2: 27 meq/L (ref 19–32)
CREATININE: 0.56 mg/dL (ref 0.50–1.10)
GFR, Est African American: >89 mL/min
GFR, Est Non African American: >89 mL/min
Glucose, Bld: 96 mg/dL (ref 70–99)
POTASSIUM: 5.5 meq/L — AB (ref 3.5–5.3)
SODIUM: 140 meq/L (ref 135–145)
TOTAL PROTEIN: 7.2 g/dL (ref 6.0–8.3)
Total Bilirubin: 0.4 mg/dL (ref 0.2–1.2)

## 2014-02-15 MED ORDER — OMEPRAZOLE 20 MG PO CPDR: 20.0000 mg | DELAYED_RELEASE_CAPSULE | Freq: Every day | ORAL | Status: DC

## 2014-02-15 NOTE — Progress Notes (Signed)
Patient Demographics  Monica Bryan, is a 23 y.o. female  ZOX:[id number]  WJX:[id number]  DOB - [birth year]  CC:  Chief Complaint  Patient presents with  . Establish Care       HPI: Monica Bryan is a 23 y.o. female here today to establish medical care.patient has history of episodic headache, takes medication when necessary, also history of gastritis/GERD patient used to be on Zantac in the past has occasional symptoms of reflux, also has been trying to gain weight , does report decreased appetite, denies any change in bowel habits denies any nausea vomiting, denies any urinary symptoms denies any menstrual irregularities.patient is up-to-date with her Pap smear. Patient has No headache, No chest pain, No abdominal pain - No Nausea, No new weakness tingling or numbness, No Cough - SOB.  No Known Allergies Past Medical History  Diagnosis Date  . No pertinent past medical history   . History of Bell's palsy    Current Outpatient Prescriptions on File Prior to Visit  Medication Sig Dispense Refill  . acetaminophen (TYLENOL) 325 MG tablet Take 325 mg by mouth daily as needed. For pain    . Prenatal Vit-Fe Fumarate-FA (PRENATAL MULTIVITAMIN) TABS Take 1 tablet by mouth daily.      No current facility-administered medications on file prior to visit.   Family History  Problem Relation Age of Onset  . Other Neg Hx   . Diabetes Maternal Aunt   . Heart disease Maternal Grandmother    History   Social History  . Marital Status: Single    Spouse Name: N/A    Number of Children: N/A  . Years of Education: N/A   Occupational History  . Not on file.   Social History Main Topics  . Smoking status: Never Smoker   . Smokeless tobacco: Never Used  . Alcohol Use: No  . Drug Use: No  . Sexual Activity: Yes    Birth Control/ Protection: None   Other Topics Concern  . Not on file   Social History Narrative    Review of Systems: Constitutional: Negative for  fever, chills, diaphoresis, activity change, appetite change and fatigue. HENT: Negative for ear pain, nosebleeds, congestion, facial swelling, rhinorrhea, neck pain, neck stiffness and ear discharge.  Eyes: Negative for pain, discharge, redness, itching and visual disturbance. Respiratory: Negative for cough, choking, chest tightness, shortness of breath, wheezing and stridor.  Cardiovascular: Negative for chest pain, palpitations and leg swelling. Gastrointestinal: Negative for abdominal distention. Genitourinary: Negative for dysuria, urgency, frequency, hematuria, flank pain, decreased urine volume, difficulty urinating and dyspareunia.  Musculoskeletal: Negative for back pain, joint swelling, arthralgia and gait problem. Neurological: Negative for dizziness, tremors, seizures, syncope, facial asymmetry, speech difficulty, weakness, light-headedness, numbness and headaches.  Hematological: Negative for adenopathy. Does not bruise/bleed easily. Psychiatric/Behavioral: Negative for hallucinations, behavioral problems, confusion, dysphoric mood, decreased concentration and agitation.    Objective:   Filed Vitals:   02/15/14 1411  BP: 108/72  Pulse: 90  Temp: 98 F (36.7 C)  Resp: 16    Physical Exam: Constitutional: Patient appears well-developed and well-nourished. No distress. HENT: Normocephalic, atraumatic, External right and left ear normal. Oropharynx is clear and moist.  Eyes: Conjunctivae and EOM are normal. PERRLA, no scleral icterus. Neck: Normal ROM. Neck supple. No JVD. No tracheal deviation. No thyromegaly. CVS: RRR, S1/S2 +, no murmurs, no gallops, no carotid bruit.  Pulmonary: Effort and breath sounds normal, no stridor, rhonchi, wheezes, rales.  Abdominal: Soft. BS +,  no distension, tenderness, rebound or guarding.  Musculoskeletal: Normal range of motion. No edema and no tenderness.  Neuro: Alert. Normal reflexes, muscle tone coordination. No cranial nerve  deficit. Skin: Skin is warm and dry. No rash noted. Not diaphoretic. No erythema. No pallor. Psychiatric: Normal mood and affect. Behavior, judgment, thought content normal.  Lab Results  Component Value Date   WBC 10.2 01/15/2012   HGB 13.7 01/15/2012   HCT 40.5 01/15/2012   MCV 91.8 01/15/2012   PLT 143* 01/15/2012   No results found for: CREATININE, BUN, NA, K, CL, CO2  No results found for: HGBA1C Lipid Panel  No results found for: CHOL, TRIG, HDL, CHOLHDL, VLDL, LDLCALC     Assessment and plan:   1. Gastroesophageal reflux disease, esophagitis presence not specified Advised patient for lifestyle modification  - omeprazole (PRILOSEC) 20 MG capsule; Take 1 capsule (20 mg total) by mouth daily.  Dispense: 30 capsule; Refill: 3  2. Nonintractable episodic headache, unspecified headache type  Takes over-the-counter medication when necessary  3. Loss of weight Will check  - CBC with Differential - COMPLETE METABOLIC PANEL WITH GFR - TSH - Vit D  25 hydroxy (rtn osteoporosis monitoring)  4. Family history of diabetes mellitus (DM)  - Hemoglobin A1c  5. Needs flu shot Flu shot today     Health Maintenance  -Pap Smear: uptodate   -Vaccinations: Flu shot today   Return in about 3 months (around 05/17/2014), or if symptoms worsen or fail to improve.   Doris CheadleADVANI, Dalbert Stillings, MD

## 2014-02-15 NOTE — Progress Notes (Signed)
Patient here with interpreter Here to establish care Has a history of gastritis  Patient has a decreased appetite  Complains of headaches on and off Concerned she has not gained any weight since her child was born almost two years ago

## 2014-02-16 LAB — TSH: TSH: 1.062 u[IU]/mL (ref 0.350–4.500)

## 2014-02-16 LAB — HEMOGLOBIN A1C
Hgb A1c MFr Bld: 5 % (ref <5.7)
Mean Plasma Glucose: 97 mg/dL (ref <117)

## 2014-02-16 LAB — VITAMIN D 25 HYDROXY (VIT D DEFICIENCY, FRACTURES): VIT D 25 HYDROXY: 30 ng/mL (ref 30–100)

## 2014-04-01 ENCOUNTER — Ambulatory Visit: Payer: Self-pay | Admitting: Neurology

## 2014-04-02 ENCOUNTER — Encounter: Payer: Self-pay | Admitting: Neurology

## 2014-04-02 ENCOUNTER — Telehealth: Payer: Self-pay | Admitting: Neurology

## 2014-04-02 NOTE — Telephone Encounter (Signed)
Pt no showed 04/01/14 NP appt w/ Dr. Everlena CooperJaffe. Referring provider office notified via EPIC referral. No show letter + our no show policy mailed to pt / Sherri S>

## 2019-03-16 NOTE — L&D Delivery Note (Signed)
OB/GYN Faculty Practice Delivery Note  Monica Bryan Monica Bryan is a 29 y.o. G2P1001 s/p VD at [redacted]w[redacted]d. She was admitted for early labor at term.   ROM: 0h 80m with clear fluid GBS Status: Positive/-- (05/25 1549) Maximum Maternal Temperature: 98.24F  Labor Progress: . Initial SVE: 2.5/50/-3. Patient received Pitocin and AROM. She then progressed to complete.   Delivery Date/Time: 6/15 @ 0235 Delivery: Called to room and patient was complete and pushing. Head delivered in ROP position. No nuchal cord present. Shoulder and body delivered in usual fashion. Infant with spontaneous cry, placed on mother's abdomen, dried and stimulated. Cord clamped x 2 after 1-minute delay, and cut by FOB. Cord blood drawn. Placenta delivered spontaneously with gentle cord traction. Fundus firm with massage and Pitocin. Labia, perineum, vagina, and cervix inspected inspected with no lacerations.  Baby Weight: pending  Placenta: Sent to L&D Complications: None Lacerations: None EBL: 207 mL Analgesia: None   Infant:  APGAR (1 MIN): 8   APGAR (5 MINS): 9  APGAR (10 MINS):     Jerilynn Birkenhead, MD [facility] Family Medicine Fellow, [facility] for [facility], [facility] 08/28/2019, 2:50 AM

## 2019-04-19 ENCOUNTER — Ambulatory Visit (INDEPENDENT_AMBULATORY_CARE_PROVIDER_SITE_OTHER): Payer: Self-pay

## 2019-04-19 ENCOUNTER — Other Ambulatory Visit: Payer: Self-pay

## 2019-04-19 DIAGNOSIS — Z3201 Encounter for pregnancy test, result positive: Secondary | ICD-10-CM

## 2019-04-19 DIAGNOSIS — Z3687 Encounter for antenatal screening for uncertain dates: Secondary | ICD-10-CM

## 2019-04-19 LAB — POCT PREGNANCY, URINE: Preg Test, Ur: POSITIVE — AB

## 2019-04-19 NOTE — Progress Notes (Signed)
Pt here today for pregnancy test. Resulted positive.  With Spanish Interpreter Raquel M., pt reports that she has not had a period for a year since stopping Depo Provera.  Pt states that she started to have nausea in December which caused her to take a pregnancy test.  OB US scheduled 04/25/19 at 0945 for dating.  Pt advised that she will come over to the office after [place] to get results.  Pt denies vaginal bleeding or pain. Medications/allergies reviewed.  Pt verbalized understanding.   Addison Naegeli, RN 04/19/19

## 2019-04-21 NOTE — Progress Notes (Signed)
Chart reviewed for nurse visit. Agree with plan of care.   Duane Lope, NP 04/21/2019 10:58 AM

## 2019-04-25 ENCOUNTER — Encounter (HOSPITAL_COMMUNITY): Payer: Self-pay

## 2019-04-25 ENCOUNTER — Ambulatory Visit (HOSPITAL_COMMUNITY)
Admission: RE | Admit: 2019-04-25 | Discharge: 2019-04-25 | Disposition: A | Discharge status: Home / Self Care | Payer: Self-pay | Source: Ambulatory Visit | Attending: Obstetrics and Gynecology | Admitting: Obstetrics and Gynecology

## 2019-04-25 ENCOUNTER — Other Ambulatory Visit: Payer: Self-pay | Admitting: Obstetrics and Gynecology

## 2019-04-25 ENCOUNTER — Ambulatory Visit (HOSPITAL_BASED_OUTPATIENT_CLINIC_OR_DEPARTMENT_OTHER)
Admission: RE | Admit: 2019-04-25 | Discharge: 2019-04-25 | Disposition: A | Discharge status: Home / Self Care | Payer: Self-pay | Source: Ambulatory Visit | Attending: Obstetrics and Gynecology | Admitting: Obstetrics and Gynecology

## 2019-04-25 ENCOUNTER — Other Ambulatory Visit: Payer: Self-pay

## 2019-04-25 ENCOUNTER — Other Ambulatory Visit (HOSPITAL_COMMUNITY): Payer: Self-pay | Admitting: *Deleted

## 2019-04-25 ENCOUNTER — Ambulatory Visit: Payer: Self-pay | Admitting: *Deleted

## 2019-04-25 DIAGNOSIS — Z3687 Encounter for antenatal screening for uncertain dates: Secondary | ICD-10-CM

## 2019-04-25 DIAGNOSIS — Z362 Encounter for other antenatal screening follow-up: Secondary | ICD-10-CM

## 2019-04-25 DIAGNOSIS — Z3A22 22 weeks gestation of pregnancy: Secondary | ICD-10-CM

## 2019-04-25 DIAGNOSIS — Z712 Person consulting for explanation of examination or test findings: Secondary | ICD-10-CM

## 2019-04-25 DIAGNOSIS — Z363 Encounter for antenatal screening for malformations: Secondary | ICD-10-CM

## 2019-04-25 NOTE — Progress Notes (Signed)
Pt received [place] results from Dr. Judeth Cornfield today.

## 2019-05-02 ENCOUNTER — Other Ambulatory Visit: Payer: Self-pay

## 2019-05-02 ENCOUNTER — Ambulatory Visit (INDEPENDENT_AMBULATORY_CARE_PROVIDER_SITE_OTHER): Payer: Self-pay | Admitting: *Deleted

## 2019-05-02 DIAGNOSIS — Z3490 Encounter for supervision of normal pregnancy, unspecified, unspecified trimester: Secondary | ICD-10-CM

## 2019-05-02 DIAGNOSIS — Z8669 Personal history of other diseases of the nervous system and sense organs: Secondary | ICD-10-CM

## 2019-05-02 DIAGNOSIS — K219 Gastro-esophageal reflux disease without esophagitis: Secondary | ICD-10-CM

## 2019-05-02 DIAGNOSIS — R519 Headache, unspecified: Secondary | ICD-10-CM | POA: Insufficient documentation

## 2019-05-02 NOTE — Patient Instructions (Signed)

## 2019-05-02 NOTE — Progress Notes (Signed)
I connected with  Kamden Reber Georgana Curio on 05/02/19 at  1:30 PM EST by telephone and verified that I am speaking with the correct person using two identifiers.   I discussed the limitations, risks, security and privacy concerns of performing an evaluation and management service by telephone and the availability of in person appointments. I also discussed with the patient that there may be a patient responsible charge related to this service. The patient expressed understanding and agreed to proceed.  I explained I am completing her New OB Intake today. We discussed Her EDD and that it is based on [place]. I reviewed her allergies, meds, OB History, Medical /Surgical history, and appropriate screenings. I informed her of [facility] services. .  I explained we will give her a blood pressure cuff at her new ob visit. We will show her how to use it. Explained  then we will have her take her blood pressure weekly . I  explained she will have some visits in office and some virtually.I explained we will help her download an app for virtual visits in the office.  I reviewed her new ob  appointment date/ time with her , our location and to wear mask, no visitors.  I explained she will have a pelvic exam, ob bloodwork, hemoglobin a1C, cbg, , and  genetic testing if desired,- she does want a panorama.she [facility] and I asked her to sign a release in the office. She has had an anatomy US and has a follow up US scheduled. She voices understanding.   Jacoria Keiffer,RN 05/02/2019  1:34 PM

## 2019-05-02 NOTE — Progress Notes (Signed)
Patient ID: Monica Bryan, female   DOB: 07-24-1990, 29 y.o.   MRN: [id number] Patient seen and assessed by nursing staff during this encounter. I have reviewed the chart and agree with the documentation and plan.  Scheryl Darter, MD 05/02/2019 2:34 PM

## 2019-05-09 ENCOUNTER — Ambulatory Visit (INDEPENDENT_AMBULATORY_CARE_PROVIDER_SITE_OTHER): Payer: Self-pay | Admitting: Obstetrics & Gynecology

## 2019-05-09 ENCOUNTER — Encounter: Payer: Self-pay | Admitting: Obstetrics & Gynecology

## 2019-05-09 ENCOUNTER — Other Ambulatory Visit: Payer: Self-pay

## 2019-05-09 VITALS — BP 109/66 | HR 91 | Wt 115.4 lb

## 2019-05-09 DIAGNOSIS — O0930 Supervision of pregnancy with insufficient antenatal care, unspecified trimester: Secondary | ICD-10-CM

## 2019-05-09 DIAGNOSIS — Z23 Encounter for immunization: Secondary | ICD-10-CM

## 2019-05-09 DIAGNOSIS — O0932 Supervision of pregnancy with insufficient antenatal care, second trimester: Secondary | ICD-10-CM

## 2019-05-09 DIAGNOSIS — O0933 Supervision of pregnancy with insufficient antenatal care, third trimester: Secondary | ICD-10-CM

## 2019-05-09 DIAGNOSIS — O99891 Other specified diseases and conditions complicating pregnancy: Secondary | ICD-10-CM

## 2019-05-09 DIAGNOSIS — Z3A24 24 weeks gestation of pregnancy: Secondary | ICD-10-CM

## 2019-05-09 DIAGNOSIS — Z283 Underimmunization status: Secondary | ICD-10-CM

## 2019-05-09 DIAGNOSIS — Z3492 Encounter for supervision of normal pregnancy, unspecified, second trimester: Secondary | ICD-10-CM

## 2019-05-09 DIAGNOSIS — Z2839 Other underimmunization status: Secondary | ICD-10-CM

## 2019-05-09 DIAGNOSIS — Z113 Encounter for screening for infections with a predominantly sexual mode of transmission: Secondary | ICD-10-CM

## 2019-05-09 DIAGNOSIS — Z124 Encounter for screening for malignant neoplasm of cervix: Secondary | ICD-10-CM

## 2019-05-09 NOTE — Progress Notes (Signed)
  Subjective:Dating [place] at 22 weeks    Monica Bryan is a G2P1001 [redacted]w[redacted]d being seen today for her first obstetrical visit.  Her obstetrical history is significant for late prenatal care. Patient does intend to breast feed. Pregnancy history fully reviewed.  Patient reports no complaints.  Vitals:   05/09/19 1524  BP: 109/66  Pulse: 91  Weight: 115 lb 6.4 oz (52.3 kg)    HISTORY: OB History  Gravida Para Term Preterm AB Living  2 1 1     1   SAB TAB Ectopic Multiple Live Births          1    # Outcome Date GA Lbr Len/2nd Weight Sex Delivery Anes PTL Lv  2 Current           1 Term 01/16/12 [redacted]w[redacted]d / 02:06 7 lb 7.2 oz (3.379 kg)  Vag-Spont EPI  LIV     Birth Comments: wnl   Past Medical History:  Diagnosis Date  . Chlamydia   . GERD (gastroesophageal reflux disease)   . Headache   . History of Bell's palsy   . No pertinent past medical history    Past Surgical History:  Procedure Laterality Date  . NO PAST SURGERIES     Family History  Problem Relation Age of Onset  . Diabetes Maternal Aunt   . Heart disease Maternal Grandmother   . Other Neg Hx      Exam    Uterus:     Pelvic Exam:    Perineum: No Hemorrhoids   Vulva: normal   Vagina:  normal mucosa   pH:     Cervix: no lesions   Adnexa: not evaluated   Bony Pelvis: average  System: Breast:  normal appearance, no masses or tenderness   Skin: normal coloration and turgor, no rashes    Neurologic: oriented, normal mood   Extremities: normal strength, tone, and muscle mass   HEENT PERRLA, sclera clear, anicteric and neck supple with midline trachea   Mouth/Teeth mucous membranes moist, pharynx normal without lesions and dental hygiene good   Neck supple and no masses   Cardiovascular: regular rate and rhythm, no murmurs or gallops   Respiratory:  appears well, vitals normal, no respiratory distress, acyanotic, normal RR, neck free of mass or lymphadenopathy, chest clear, no wheezing,  crepitations, rhonchi, normal symmetric air entry   Abdomen: gravid and not tender   Urinary: urethral meatus normal      Assessment:    Pregnancy: G2P1001 Patient Active Problem List   Diagnosis Date Noted  . Supervision of low-risk pregnancy 05/02/2019  . History of Bell's palsy   . Headache   . Family history of diabetes mellitus (DM) 02/15/2014  . Loss of weight 02/15/2014  . Cephalalgia 02/15/2014  . Esophageal reflux 02/15/2014  . Insufficient prenatal care 12/22/2011        Plan:     Initial labs drawn. Prenatal vitamins. Problem list reviewed and updated. Genetic Screening discussed Panorama.  Ultrasound discussed; fetal survey: results reviewed.  Follow up in 4 weeks. 50% of 30 min visit spent on counseling and coordination of care.  2 hr GTT next    02/21/2012 05/09/2019

## 2019-05-09 NOTE — Patient Instructions (Signed)
Segundo trimestre de embarazo Second Trimester of Pregnancy  El segundo trimestre va desde la semana14 hasta la 27 (desde el mes 4 hasta el 6). Este suele ser el momento en el que mejor se siente. En general, las nuseas matutinas han disminuido o han desaparecido completamente. Tendr ms energa y podr aumentarle el apetito. El beb en gestacin se desarrolla rpidamente. Hacia el final del sexto mes, el beb mide aproximadamente 9 pulgadas (23 cm) y pesa alrededor de 1 libras (700 g). Es probable que sienta al beb moverse entre las 18 y 20 semanas del embarazo. Siga estas indicaciones en su casa: Medicamentos  Tome los medicamentos de venta libre y los recetados solamente como se lo haya indicado el mdico. Algunos medicamentos son seguros para tomar durante el embarazo y otros no lo son.  Tome vitaminas prenatales que contengan por lo menos 600microgramos (?g) de cido flico.  Si tiene dificultad para mover el intestino (estreimiento), tome un medicamento para ablandar las heces (laxante) si su mdico se lo autoriza. Comida y bebida   Ingiera alimentos saludables de manera regular.  No coma carne cruda ni quesos sin cocinar.  Si obtiene poca cantidad de calcio de los alimentos que ingiere, consulte a su mdico sobre la posibilidad de tomar un suplemento diario de calcio.  Evite el consumo de alimentos ricos en grasas y azcares, como los alimentos fritos y los dulces.  Si tiene malestar estomacal (nuseas) o devuelve (vomita): ? Ingiera 4 o 5comidas pequeas por da en lugar de 3abundantes. ? Intente comer algunas galletitas saladas. ? Beba lquidos entre las comidas, en lugar de hacerlo durante estas.  Para evitar el estreimiento: ? Consuma alimentos ricos en fibra, como frutas y verduras frescas, cereales integrales y frijoles. ? Beba suficiente lquido para mantener el pis (orina) claro o de color amarillo plido. Actividad  Haga ejercicios solamente como se lo haya  indicado el mdico. Interrumpa la actividad fsica si comienza a tener calambres.  No haga ejercicio si hace demasiado calor, hay demasiada humedad o se encuentra en un lugar de mucha altura (altitud alta).  Evite levantar pesos excesivos.  Use zapatos con tacones bajos. Mantenga una buena postura al sentarse y pararse.  Puede continuar teniendo relaciones sexuales, a menos que el mdico le indique lo contrario. Alivio del dolor y del malestar  Use un sostn que le brinde buen soporte si sus mamas estn sensibles.  Dese baos de asiento con agua tibia para aliviar el dolor o las molestias causadas por las hemorroides. Use una crema para las hemorroides si el mdico la autoriza.  Descanse con las piernas elevadas si tiene calambres o dolor de cintura.  Si desarrolla venas hinchadas y abultadas (vrices) en las piernas: ? Use medias de compresin o medias de descanso como se lo haya indicado el mdico. ? Levante (eleve) los pies durante 15minutos, 3 o 4veces por da. ? Limite el consumo de sal en sus alimentos. Cuidado prenatal  Escriba sus preguntas. Llvelas cuando concurra a las visitas prenatales.  Concurra a todas las visitas prenatales como se lo haya indicado el mdico. Esto es importante. Seguridad  Colquese el cinturn de seguridad cuando conduzca.  Haga una lista de los nmeros de telfono de emergencia, que incluya los nmeros de telfono de familiares, amigos, el hospital, as como los departamentos de polica y bomberos. Instrucciones generales  Consulte a su mdico sobre los alimentos que debe comer o pdale que la ayude a encontrar a quien pueda aconsejarla si necesita ese servicio.    Consulte a su mdico acerca de dnde se dictan clases prenatales cerca de donde vive. Comience las clases antes del mes 6 de embarazo.  No se d baos de inmersin en agua caliente, baos turcos ni saunas.  No se haga duchas vaginales ni use tampones o toallas higinicas perfumadas.   No mantenga las piernas cruzadas durante mucho tiempo.  Vaya al dentista si an no lo hizo. Use un cepillo de cerdas suaves para cepillarse los dientes. Psese el hilo dental suavemente.  No fume, no consuma hierbas ni beba alcohol. No tome frmacos que el mdico no haya autorizado.  No consuma ningn producto que contenga nicotina o tabaco, como cigarrillos y cigarrillos electrnicos. Si necesita ayuda para dejar de fumar, consulte al mdico.  Evite el contacto con las bandejas sanitarias de los gatos y la tierra que estos animales usan. Estos elementos contienen bacterias que pueden causar defectos congnitos al beb y la posible prdida del beb (aborto espontneo) o la muerte fetal. Comunquese con un mdico si:  Tiene clicos leves o siente presin en la parte baja del vientre.  Tiene dolor al hacer pis (orinar).  Advierte un lquido con olor ftido que proviene de la vagina.  Tiene malestar estomacal (nuseas), devuelve (vomita) o tiene deposiciones acuosas (diarrea).  Sufre un dolor persistente en el abdomen.  Siente mareos. Solicite ayuda de inmediato si:  Tiene fiebre.  Tiene una prdida de lquido por la vagina.  Tiene sangrado o pequeas prdidas vaginales.  Siente dolor intenso o clicos en el abdomen.  Sube o baja de peso rpidamente.  Tiene dificultades para recuperar el aliento y siente dolor en el pecho.  Sbitamente se le hinchan mucho el rostro, las manos, los tobillos, los pies o las piernas.  No ha sentido los movimientos del beb durante una hora.  Siente un dolor de cabeza intenso que no se alivia al tomar medicamentos.  Tiene dificultad para ver. Resumen  El segundo trimestre va desde la semana14 hasta la 27, desde el mes 4 hasta el 6. Este suele ser el momento en el que mejor se siente.  Para cuidarse y cuidar a su beb en gestacin, debe comer alimentos saludables, tomar medicamentos solamente si su mdico le indica que lo haga y hacer  actividades que sean seguras para usted y su beb.  Llame al mdico si se enferma o si nota algo inusual acerca de su embarazo. Tambin llame al mdico si necesita ayuda para saber qu alimentos debe comer o si quiere saber qu actividades puede realizar de forma segura. Esta informacin no tiene como fin reemplazar el consejo del mdico. Asegrese de hacerle al mdico cualquier pregunta que tenga. Document Revised: 11/24/2016 Document Reviewed: 11/24/2016 Elsevier Patient Education  2020 Elsevier Inc.  

## 2019-05-10 DIAGNOSIS — Z2839 Other underimmunization status: Secondary | ICD-10-CM | POA: Insufficient documentation

## 2019-05-10 DIAGNOSIS — Z283 Underimmunization status: Secondary | ICD-10-CM | POA: Insufficient documentation

## 2019-05-10 LAB — HEMOGLOBIN A1C
Est. average glucose Bld gHb Est-mCnc: 77 mg/dL
Hgb A1c MFr Bld: 4.3 % — ABNORMAL LOW (ref 4.8–5.6)

## 2019-05-10 LAB — OBSTETRIC PANEL, INCLUDING HIV
Antibody Screen: NEGATIVE
Basophils Absolute: 0 10*3/uL (ref 0.0–0.2)
Basos: 0 %
EOS (ABSOLUTE): 0 10*3/uL (ref 0.0–0.4)
Eos: 0 %
HIV Screen 4th Generation wRfx: NONREACTIVE
Hematocrit: 31.1 % — ABNORMAL LOW (ref 34.0–46.6)
Hemoglobin: 10.9 g/dL — ABNORMAL LOW (ref 11.1–15.9)
Hepatitis B Surface Ag: NEGATIVE
Immature Grans (Abs): 0.1 10*3/uL (ref 0.0–0.1)
Immature Granulocytes: 1 %
Lymphocytes Absolute: 1.8 10*3/uL (ref 0.7–3.1)
Lymphs: 21 %
MCH: 32.2 pg (ref 26.6–33.0)
MCHC: 35 g/dL (ref 31.5–35.7)
MCV: 92 fL (ref 79–97)
Monocytes Absolute: 0.6 10*3/uL (ref 0.1–0.9)
Monocytes: 7 %
Neutrophils Absolute: 6.1 10*3/uL (ref 1.4–7.0)
Neutrophils: 71 %
Platelets: 152 10*3/uL (ref 150–450)
RBC: 3.39 x10E6/uL — ABNORMAL LOW (ref 3.77–5.28)
RDW: 12.7 % (ref 11.7–15.4)
RPR Ser Ql: NONREACTIVE
Rh Factor: POSITIVE
Rubella Antibodies, IGG: <0.9 index — ABNORMAL LOW (ref >0.99)
WBC: 8.7 10*3/uL (ref 3.4–10.8)

## 2019-05-10 LAB — CYTOLOGY - PAP
Chlamydia: NEGATIVE
Comment: NEGATIVE
Comment: NORMAL
Diagnosis: NEGATIVE
Neisseria Gonorrhea: NEGATIVE

## 2019-05-12 LAB — CULTURE, OB URINE

## 2019-05-12 LAB — URINE CULTURE, OB REFLEX

## 2019-05-14 ENCOUNTER — Telehealth: Payer: Self-pay

## 2019-05-14 NOTE — Telephone Encounter (Signed)
-----   Message from Adam Phenix, MD sent at 05/12/2019  3:01 PM EST ----- Bacteria considered a contaminant, no treatment

## 2019-05-14 NOTE — Telephone Encounter (Signed)
Advised patient Via Penne Lash spanish interpreter that the Bacteria that was found is considered a contaminant, and no treatment needed. Patient asked if it was harmful for baby advised otherwise and she  Verbalized understanding and ended the call.

## 2019-05-21 ENCOUNTER — Encounter: Payer: Self-pay | Admitting: *Deleted

## 2019-05-23 ENCOUNTER — Other Ambulatory Visit: Payer: Self-pay

## 2019-05-23 ENCOUNTER — Ambulatory Visit (HOSPITAL_COMMUNITY)
Admission: RE | Admit: 2019-05-23 | Discharge: 2019-05-23 | Disposition: A | Discharge status: Home / Self Care | Payer: Self-pay | Source: Ambulatory Visit | Attending: Obstetrics and Gynecology | Admitting: Obstetrics and Gynecology

## 2019-05-23 DIAGNOSIS — Z3A16 16 weeks gestation of pregnancy: Secondary | ICD-10-CM

## 2019-05-23 DIAGNOSIS — Z362 Encounter for other antenatal screening follow-up: Secondary | ICD-10-CM | POA: Insufficient documentation

## 2019-05-31 ENCOUNTER — Encounter: Payer: Self-pay | Admitting: *Deleted

## 2019-06-07 ENCOUNTER — Encounter: Payer: Self-pay | Admitting: *Deleted

## 2019-06-07 DIAGNOSIS — Z3490 Encounter for supervision of normal pregnancy, unspecified, unspecified trimester: Secondary | ICD-10-CM

## 2019-06-07 NOTE — Progress Notes (Signed)
Low risk female on 60

## 2019-06-08 ENCOUNTER — Telehealth: Payer: Self-pay | Admitting: *Deleted

## 2019-06-08 NOTE — Telephone Encounter (Signed)
Called patient with [facility] interpreter # [phone] and notified of Panorama results of low risk. She voices understanding. I also reminded her of her next ob appointment Monica Abeln,RN

## 2019-06-08 NOTE — Telephone Encounter (Signed)
-----   Message from Adam Phenix, MD sent at 06/07/2019 11:57 AM EDT ----- Low risk female on Panorama

## 2019-06-13 ENCOUNTER — Other Ambulatory Visit: Payer: Self-pay | Admitting: General Practice

## 2019-06-13 DIAGNOSIS — Z3493 Encounter for supervision of normal pregnancy, unspecified, third trimester: Secondary | ICD-10-CM

## 2019-06-14 ENCOUNTER — Other Ambulatory Visit: Payer: Self-pay

## 2019-06-14 ENCOUNTER — Ambulatory Visit (INDEPENDENT_AMBULATORY_CARE_PROVIDER_SITE_OTHER): Payer: Self-pay | Admitting: Obstetrics and Gynecology

## 2019-06-14 VITALS — BP 105/69 | HR 101 | Wt 122.0 lb

## 2019-06-14 DIAGNOSIS — Z23 Encounter for immunization: Secondary | ICD-10-CM

## 2019-06-14 DIAGNOSIS — Z3493 Encounter for supervision of normal pregnancy, unspecified, third trimester: Secondary | ICD-10-CM

## 2019-06-14 DIAGNOSIS — Z3A39 39 weeks gestation of pregnancy: Secondary | ICD-10-CM

## 2019-06-14 NOTE — Progress Notes (Signed)
   PRENATAL VISIT NOTE  Subjective:  Monica Bryan is a 29 y.o. G2P1001 at [redacted]w[redacted]d being seen today for ongoing prenatal care.  She is currently monitored for the following issues for this low-risk pregnancy and has Insufficient prenatal care; Family history of diabetes mellitus (DM); Loss of weight; Cephalalgia; Esophageal reflux; Supervision of low-risk pregnancy; History of Bell's palsy; Headache; and Rubella non-immune status, antepartum on their problem list.  Patient reports no complaints.  Contractions: Not present. Vag. Bleeding: None.  Movement: Present. Denies leaking of fluid.   The following portions of the patient's history were reviewed and updated as appropriate: allergies, current medications, past family history, past medical history, past social history, past surgical history and problem list.   Objective:   Vitals:   06/14/19 0828  BP: 105/69  Pulse: (!) 101  Weight: 122 lb (55.3 kg)    Fetal Status: Fetal Heart Rate (bpm): 144 Fundal Height: 29 cm Movement: Present     General:  Alert, oriented and cooperative. Patient is in no acute distress.  Skin: Skin is warm and dry. No rash noted.   Cardiovascular: Normal heart rate noted  Respiratory: Normal respiratory effort, no problems with respiration noted  Abdomen: Soft, gravid, appropriate for gestational age.  Pain/Pressure: Absent     Pelvic: Cervical exam deferred        Extremities: Normal range of motion.  Edema: None  Mental Status: Normal mood and affect. Normal behavior. Normal judgment and thought content.   Assessment and Plan:  Pregnancy: G2P1001 at [redacted]w[redacted]d  1. Encounter for supervision of low-risk pregnancy in third trimester  - 2 hour GTT today. - Tdap vaccine greater than or equal to 7yo IM  Preterm labor symptoms and general obstetric precautions including but not limited to vaginal bleeding, contractions, leaking of fluid and fetal movement were reviewed in detail with the patient.  Please refer to After Visit Summary for other counseling recommendations.   Return in about 2 weeks (around 06/28/2019) for virtual ok .  No future appointments.  Venia Carbon, NP

## 2019-06-14 NOTE — Patient Instructions (Signed)
Magnesium 200 mg at bedtime  [facility] healthy baby. Com  Extra strength tylenol 1000 4x per day is ok.

## 2019-06-15 LAB — GLUCOSE TOLERANCE, 2 HOURS W/ 1HR
Glucose, 1 hour: 127 mg/dL (ref 65–179)
Glucose, 2 hour: 100 mg/dL (ref 65–152)
Glucose, Fasting: 88 mg/dL (ref 65–91)

## 2019-06-15 LAB — CBC
Hematocrit: 32.7 % — ABNORMAL LOW (ref 34.0–46.6)
Hemoglobin: 11.4 g/dL (ref 11.1–15.9)
MCH: 32.9 pg (ref 26.6–33.0)
MCHC: 34.9 g/dL (ref 31.5–35.7)
MCV: 95 fL (ref 79–97)
Platelets: 194 10*3/uL (ref 150–450)
RBC: 3.46 x10E6/uL — ABNORMAL LOW (ref 3.77–5.28)
RDW: 12.6 % (ref 11.7–15.4)
WBC: 8.2 10*3/uL (ref 3.4–10.8)

## 2019-06-15 LAB — RPR: RPR Ser Ql: NONREACTIVE

## 2019-06-15 LAB — HIV ANTIBODY (ROUTINE TESTING W REFLEX): HIV Screen 4th Generation wRfx: NONREACTIVE

## 2019-06-29 ENCOUNTER — Encounter: Payer: Self-pay | Admitting: Medical

## 2019-06-29 ENCOUNTER — Other Ambulatory Visit: Payer: Self-pay

## 2019-06-29 ENCOUNTER — Telehealth (INDEPENDENT_AMBULATORY_CARE_PROVIDER_SITE_OTHER): Payer: Self-pay | Admitting: Medical

## 2019-06-29 VITALS — BP 105/74 | HR 94

## 2019-06-29 DIAGNOSIS — Z283 Underimmunization status: Secondary | ICD-10-CM

## 2019-06-29 DIAGNOSIS — Z3A31 31 weeks gestation of pregnancy: Secondary | ICD-10-CM

## 2019-06-29 DIAGNOSIS — Z3493 Encounter for supervision of normal pregnancy, unspecified, third trimester: Secondary | ICD-10-CM

## 2019-06-29 DIAGNOSIS — O09899 Supervision of other high risk pregnancies, unspecified trimester: Secondary | ICD-10-CM

## 2019-06-29 DIAGNOSIS — O99891 Other specified diseases and conditions complicating pregnancy: Secondary | ICD-10-CM

## 2019-06-29 NOTE — Progress Notes (Signed)
   TELEHEALTH VIRTUAL OBSTETRICS VISIT ENCOUNTER NOTE  I connected with Monica Bryan on 06/29/19 at  9:35 AM EDT by telephone after she was unable to connect to [place] at home and verified that I am speaking with the correct person using two identifiers. Monica Bryan interpreter was present for the visit.    I discussed the limitations, risks, security and privacy concerns of performing an evaluation and management service by telephone and the availability of in person appointments. I also discussed with the patient that there may be a patient responsible charge related to this service. The patient expressed understanding and agreed to proceed.  Subjective:  Monica Bryan is a 29 y.o. G2P1001 at 71w2dbeing followed for ongoing prenatal care.  She is currently monitored for the following issues for this low-risk pregnancy and has Insufficient prenatal care; Family history of diabetes mellitus (DM); Loss of weight; Cephalalgia; Esophageal reflux; Supervision of low-risk pregnancy; History of Bell's palsy; Headache; and Rubella non-immune status, antepartum on their problem list.  Patient reports no complaints. Reports fetal movement. Denies any contractions, bleeding or leaking of fluid.   The following portions of the patient's history were reviewed and updated as appropriate: allergies, current medications, past family history, past medical history, past social history, past surgical history and problem list.   Objective:   General:  Alert, oriented and cooperative.   Mental Status: Normal mood and affect perceived. Normal judgment and thought content.  Rest of physical exam deferred due to type of encounter  Assessment and Plan:  Pregnancy: G2P1001 at 356w2d. Encounter for supervision of low-risk pregnancy in third trimester - Doing well - Reviewed recent labs and USKoreaesults with patient, due date confirmed with follow-up USKorean 3/10 - All questions answered  -  Does not have BRx, advised to call for BP > 140/90  2. Rubella non-immune status, antepartum - Needs PP MMR   Preterm labor symptoms and general obstetric precautions including but not limited to vaginal bleeding, contractions, leaking of fluid and fetal movement were reviewed in detail with the patient.  I discussed the assessment and treatment plan with the patient. The patient was provided an opportunity to ask questions and all were answered. The patient agreed with the plan and demonstrated an understanding of the instructions. The patient was advised to call back or seek an in-person office evaluation/[facility] any urgent or concerning symptoms. Please refer to After Visit Summary for other counseling recommendations.   I provided 10 minutes of non-face-to-face time during this encounter.  Return in about 2 weeks (around 07/13/2019) for LOB, Virtual.  No future appointments.  JuKerry HoughPA-C [facility]

## 2019-06-29 NOTE — Patient Instructions (Signed)
Evaluacin de los movimientos fetales Fetal Movement Counts Nombre del paciente: ________________________________________________ Monica Bryan estimada: ____________________ Young Berry evaluacin de los movimientos fetales?  Una evaluacin de los movimientos fetales es el registro del nmero de veces que siente que el beb se mueve durante un cierto perodo de [name]. Esto tambin se puede denominar recuento de patadas fetales. Una evaluacin de movimientos fetales se recomienda a todas las embarazadas. Es posible que le indiquen que comience a Development worker, community los movimientos fetales desde la semana 28 de [place]. Preste atencin cuando sienta que el beb est ms activo. Podr detectar los ciclos en que el beb duerme y est despierto. Tambin podr detectar que ciertas cosas hacen que su beb se mueva ms. Deber realizar una evaluacin de los movimientos fetales en las siguientes situaciones:  Cuando el beb est ms activo habitualmente.  A la [employer], todos los [place]. Un buen momento para evaluar los movimientos fetales es cuando est descansando, despus de haber comido y bebido algo. Cmo debo contar los movimientos fetales? 1. Encuentre un lugar tranquilo y cmodo. Sintese o acustese de lado. 2. Anote la fecha, la hora de inicio y de finalizacin y la cantidad de movimientos que sinti entre esas dos horas. Lleve esta informacin a las visitas de control. 3. Anote la hora de inicio cuando Designer, industrial/product. 4. Cuente las pataditas, revoloteos, chasquidos, vueltas o pinchazos. Debe sentir al menos . 5. Puede dejar de contar despus de haber sentido 10 movimientos o de haber contado [name]. Anote la hora de finalizacin. 6. Si no siente en 2horas, comunquese con su mdico para obtener ms indicaciones. Es posible que el mdico quiera realizar estudios adicionales para Company secretary del beb. Comunquese con un mdico si:  Siente  menos de en 2horas.  El beb no se mueve tanto como suele hacerlo. Fecha: ____________ [name] inicio: ____________ [name] finalizacin: ____________ Movimientos: ____________ [name]: ____________ [name] inicio: ____________ [name] finalizacin: ____________ Movimientos: ____________ [name]: ____________ [name] inicio: ____________ [name] finalizacin: ____________ Movimientos: ____________ [name]: ____________ [name] inicio: ____________ [name] finalizacin: ____________ Movimientos: ____________ [name]: ____________ [name] inicio: ____________ [name] de finalizacin: ____________ Movimientos: ____________ [name]: ____________ [name] inicio: ____________ [name] de finalizacin: ____________ Movimientos: ____________ [name]: ____________ [name] inicio: ____________ [name] de finalizacin: ____________ Movimientos: ____________ [name]: ____________ [name] inicio: ____________ [name] finalizacin: ____________ Movimientos: ____________ [name]: ____________ [name] inicio: ____________ [name] de finalizacin: ____________ Movimientos: ____________ Esta informacin no tiene como fin reemplazar el consejo del mdico. Asegrese de hacerle al mdico cualquier pregunta que tenga. Document Revised: 12/26/2018 Document Reviewed: 12/26/2018 Elsevier Patient Education  2020 [employer]. [employer]AC[employer] de Braxton Hicks Braxton Hicks Contractions Las contracciones del tero pueden presentarse durante todo el [place], [place] no siempre indican que la mujer est de [place]. Es posible que usted haya tenido contracciones de prctica llamadas "contracciones de [place]". A veces, se las confunde con el parto real. Qu son las contracciones de [place]? Las contracciones de [place] son espasmos que se producen en los msculos del tero antes del [place]. A diferencia de las contracciones del parto verdadero, estas no producen el agrandamiento (la dilatacin) ni el afinamiento del cuello  uterino. Hacia el final del embarazo [facility] las semanas [phone]), las contracciones de Braxton Hicks pueden presentarse ms seguido y tornarse ms intensas. A veces, resulta difcil distinguirlas del parto verdadero porque pueden ser [employer]. No debe sentirse avergonzada si concurre al  hospital con falso parto. En ocasiones, la nica forma de saber si el trabajo de parto es verdadero es que el mdico determine si hay cambios en el cuello del tero. El mdico le har un examen fsico y quizs le controle las contracciones. Si usted no est de parto verdadero, el examen debe indicar que el cuello uterino no est dilatado y que usted no ha roto bolsa. Si no hay otros problemas de salud asociados con su embarazo, no habr inconvenientes si la envan a su casa con un falso parto. Es posible que las contracciones de Braxton Hicks continen hasta que se desencadene el parto verdadero. Cmo diferenciar el trabajo de parto falso del verdadero Trabajo de parto verdadero  Las contracciones duran de 30a70segundos.  Las contracciones pueden tornarse muy regulares.  La molestia generalmente se siente en la parte superior del tero y se extiende hacia la zona baja del abdomen y hacia la cintura.  Las contracciones no desaparecen cuando usted camina.  Las contracciones generalmente se hacen ms intensas y aumentan en frecuencia.  El cuello uterino se dilata y se afina. Parto falso  En general, las contracciones son ms cortas y no tan intensas como las del parto verdadero.  En general, las contracciones son irregulares.  A menudo, las contracciones se sienten en la parte delantera de la parte baja del abdomen y en la ingle.  Las contracciones pueden desaparecer cuando usted camina o cambia de posicin mientras est acostada.  Las contracciones se vuelven ms dbiles y su duracin es menor a medida que transcurre el tiempo.  En general, el cuello uterino no se dilata ni se afina. Siga estas  indicaciones en su casa:   Tome los medicamentos de venta libre y los recetados solamente como se lo haya indicado el mdico.  Contine haciendo los ejercicios habituales y siga las dems indicaciones que el mdico le d.  Coma y beba con moderacin si cree que est de parto.  Si las contracciones de Braxton Hicks le provocan incomodidad: ? Cambie de posicin: si est acostada o descansando, camine; si est caminando, descanse. ? Sintese y descanse en una baera con agua tibia. ? Beba suficiente lquido como para mantener la orina de color amarillo plido. La deshidratacin puede provocar contracciones. ? Respire lenta y profundamente varias veces por hora.  Vaya a todas las visitas de control prenatales y de control como se lo haya indicado el mdico. Esto es importante. Comunquese con un mdico si:  Tiene fiebre.  Siente dolor constante en el abdomen. Solicite ayuda de inmediato si:  Las contracciones se intensifican, se hacen ms regulares y cercanas entre s.  Tiene una prdida de lquido por la vagina.  Elimina una mucosidad sanguinolenta (prdida del tapn mucoso).  Tiene una hemorragia vaginal.  Tiene un dolor en la zona lumbar que nunca tuvo antes.  Siente que la cabeza del beb empuja hacia abajo y ejerce presin en la zona plvica.  El beb no se mueve tanto como antes. Resumen  Las contracciones que se presentan antes del parto se conocen como contracciones de Braxton Hicks, falso parto o contracciones de prctica.  En general, las contracciones de Braxton Hicks son ms cortas, ms dbiles, con ms tiempo entre una y otra, y menos regulares que las contracciones del parto verdadero. Las contracciones del parto verdadero se intensifican progresivamente y se tornan regulares y ms frecuentes.  Para controlar la molestia que producen las contracciones de Braxton Hicks, puede cambiar de posicin, darse un bao templado y   descansar, beber mucha agua o practicar la  respiracin profunda. Esta informacin no tiene Theme park manager el consejo del mdico. Asegrese de hacerle al mdico cualquier pregunta que tenga. Document Revised: 06/10/2017 Document Reviewed: 10/11/2016 Elsevier Patient Education  2020 [employer].

## 2019-06-29 NOTE — Progress Notes (Signed)
I connected with  Loleta Frommelt Georgana Curio on 06/29/19 at  9:35 AM EDT by telephone and verified that I am speaking with the correct person using two identifiers.   I discussed the limitations, risks, security and privacy concerns of performing an evaluation and management service by telephone and the availability of in person appointments. I also discussed with the patient that there may be a patient responsible charge related to this service. The patient expressed understanding and agreed to proceed.  Henrietta Dine, CMA 06/29/2019  9:50 AM

## 2019-07-17 ENCOUNTER — Other Ambulatory Visit: Payer: Self-pay

## 2019-07-17 ENCOUNTER — Ambulatory Visit (INDEPENDENT_AMBULATORY_CARE_PROVIDER_SITE_OTHER): Payer: Self-pay | Admitting: Advanced Practice Midwife

## 2019-07-17 VITALS — BP 111/75 | HR 102

## 2019-07-17 DIAGNOSIS — Z789 Other specified health status: Secondary | ICD-10-CM

## 2019-07-17 DIAGNOSIS — Z3483 Encounter for supervision of other normal pregnancy, third trimester: Secondary | ICD-10-CM

## 2019-07-17 DIAGNOSIS — Z3493 Encounter for supervision of normal pregnancy, unspecified, third trimester: Secondary | ICD-10-CM

## 2019-07-17 DIAGNOSIS — Z3A33 33 weeks gestation of pregnancy: Secondary | ICD-10-CM

## 2019-07-17 NOTE — Patient Instructions (Signed)
Prueba de deteccin de estreptococos del grupo B durante el embarazo Group B Streptococcus Test During Pregnancy Por qu me debo realizar esta prueba? Se recomienda realizar pruebas de rutina, tambin llamadas pruebas de deteccin, para estreptococos del grupo B (EGB) entre la semana 36 y 37 de Psychiatrist. Los EGB pertenecen a un tipo de bacteria que puede transmitirse de la madre al beb durante el parto. Las pruebas de Financial controller a Chief Strategy Officer si usted Pension scheme manager o no tratamiento durante el [place] de parto y [place] para evitar complicaciones como:  Una infeccin en el tero durante el [place] de [place].  Una infeccin en el tero despus del parto.  Una infeccin grave en el beb despus del parto, como neumona, meningitis o sepsis. En general, la prueba de deteccin de EGB no se realiza antes de las [address] de [address], a menos que comience el trabajo de parto prematuramente. Qu sucede si tengo estreptococos del grupo B? Si las pruebas [employer] tiene EGB, Administrator tratamiento con antibiticos intravenosos durante el [place] de parto y [place]. Este tratamiento disminuye significativamente el riesgo de complicaciones para usted y el beb. Si tiene una cesrea planificada y tiene EGB, es posible que no necesite tratamiento con antibiticos porque los EGB se transmiten a los bebs generalmente despus de que comienza el [place] de parto y se rompe la bolsa de [place]. Si est en trabajo de parto o rompe la bolsa de aguas antes de la cesrea, es posible que los EGB se introduzcan en el tero y pasen al beb; en tal caso podra necesitar tratamiento. Existe la posibilidad de que no necesite hacerme pruebas? No es necesario que se le realice una prueba de deteccin de EGB si:  Tiene un anlisis de orina que [place] EGB antes de la semana 36 a 37.  Tuvo un beb con infeccin por EGB despus de un parto anterior. En [name], se la tratar automticamente para los  EGB durante el [place] de parto y [place]. Qu se analiza? Esta prueba se realiza para verificar si tiene estreptococos del grupo B en la vagina o el recto. Qu tipo de [place] se toma? Para obtener muestras para esta prueba, Public librarian un exudado vaginal y rectal con un hisopo de algodn. Luego, la muestra se enva al laboratorio para determinar si hay EGB. Qu ocurre durante la prueba?   Usted se Chief Strategy Officer ropa de la cintura [place].  Deber acostarse en una camilla en la misma posicin que lo hara para un examen plvico.  El Production manager un exudado vaginal y rectal con un hisopo de algodn para una prueba de cultivo.  Podr irse a casa y Education officer, environmental sus actividades habituales inmediatamente despus de los estudios. Cmo se informan los resultados? Los [employer] de la prueba se informan como positivos o negativos. Qu significan los resultados?  Una prueba positiva significa que usted tiene riesgo de [employer] EGB al beb durante el [place] de parto y [place]. El Market researcher tratamiento con antibiticos intravenosos durante el [place] de parto y [place].  Una prueba con resultado negativo significa que tiene un riesgo muy bajo de transmitirle los EGB al beb. An existe un riesgo bajo de transmitir los EGB al beb porque a veces, los [employer] de la prueba pueden informar que usted no tiene una afeccin cuando realmente la tiene (resultado falso-negativo) o existe la posibilidad de que pueda infectarse con los EGB despus de la realizacin  de la prueba. Es muy probable que no necesite tratamiento con un antibitico durante el [place] de parto y [place]. Hable con su mdico sobre lo que significan sus [place]. Preguntas para hacerle al mdico Consulte a su mdico o pregunte en el departamento donde se realiza la prueba acerca de lo siguiente:  Cundo estarn disponibles mis resultados?  Cmo obtendr mis resultados?  Cules son  las opciones de tratamiento? Resumen  Se recomienda a todas las mujeres embarazadas la realizacin de pruebas de rutina (pruebas de Airline pilot) para la deteccin de estreptococos del grupo B (EGB) entre la semana 36 y 37 de Psychiatrist.  Los EGB pertenecen a un tipo de bacteria que puede transmitirse de la madre al beb durante el parto.  Si las pruebas [employer] tiene EGB, Administrator tratamiento con antibiticos intravenosos durante el [place] de parto y [place]. Este tratamiento casi siempre evita la infeccin en los recin nacidos. Esta informacin no tiene Theme park manager el consejo del mdico. Asegrese de hacerle al mdico cualquier pregunta que tenga. Document Revised: 05/03/2018 Document Reviewed: 05/03/2018 Elsevier Patient Education  2020 [employer].

## 2019-07-17 NOTE — Progress Notes (Signed)
I connected with  Monica Bryan on 07/17/19 at  3:55 PM EDT by telephone and verified that I am speaking with the correct person using two identifiers.   I discussed the limitations, risks, security and privacy concerns of performing an evaluation and management service by telephone and the availability of in person appointments. I also discussed with the patient that there may be a patient responsible charge related to this service. The patient expressed understanding and agreed to proceed.  Ernestina Patches, CMA 07/17/2019  3:45 PM

## 2019-07-17 NOTE — Progress Notes (Signed)
   OBSTETRICS PRENATAL VIRTUAL VISIT ENCOUNTER NOTE  Provider location: [facility]   I connected with Derek Mound Georgana Curio on 07/17/19 at  3:55 PM EDT by MyChart Video Encounter at home and verified that I am speaking with the correct person using two identifiers.   I discussed the limitations, risks, security and privacy concerns of performing an evaluation and management service virtually and the availability of in person appointments. I also discussed with the patient that there may be a patient responsible charge related to this service. The patient expressed understanding and agreed to proceed. Subjective:  Monica Bryan is a 29 y.o. G2P1001 at [redacted]w[redacted]d being seen today for ongoing prenatal care.  She is currently monitored for the following issues for this low-risk pregnancy and has Insufficient prenatal care; Family history of diabetes mellitus (DM); Loss of weight; Cephalalgia; Esophageal reflux; Supervision of low-risk pregnancy; History of Bell's palsy; Headache; and Rubella non-immune status, antepartum on their problem list.  Patient reports difficulty getting comfortable, interrupted sleep.  Contractions: Not present. Vag. Bleeding: None.  Movement: Present. Denies any leaking of fluid.   The following portions of the patient's history were reviewed and updated as appropriate: allergies, current medications, past family history, past medical history, past social history, past surgical history and problem list.   Objective:   Vitals:   07/17/19 1546  BP: 111/75  Pulse: (!) 102    Fetal Status:     Movement: Present     General:  Alert, oriented and cooperative. Patient is in no acute distress.  Respiratory: Normal respiratory effort, no problems with respiration noted  Mental Status: Normal mood and affect. Normal behavior. Normal judgment and thought content.  Rest of physical exam deferred due to type of  encounter  Imaging: No results found.  Assessment and Plan:  Pregnancy: G2P1001 at [redacted]w[redacted]d 1. Encounter for supervision of low-risk pregnancy in third trimester - Routine care - 36 week swabs next visit. Discussed intrapartum treatment in event of GBS +  2. Language barrier affecting health care - Spanish language interpreter utilized for all patient interaction  Preterm labor symptoms and general obstetric precautions including but not limited to vaginal bleeding, contractions, leaking of fluid and fetal movement were reviewed in detail with the patient. I discussed the assessment and treatment plan with the patient. The patient was provided an opportunity to ask questions and all were answered. The patient agreed with the plan and demonstrated an understanding of the instructions. The patient was advised to call back or seek an in-person office evaluation/go to MAU at [facility] for any urgent or concerning symptoms. Please refer to After Visit Summary for other counseling recommendations.   I provided eight minutes of non- face-to-face time during this encounter. Patient was initially scheduled as a MyChart appointment but her husband needed their cell phone and she requested that her appointment be converted to a telephone appointment.  No follow-ups on file.  Future Appointments  Date Time Provider Department Center  08/07/2019  3:55 PM Marylene Land, CNM [facility]    Calvert Cantor, CNM Center for [employer], [facility]

## 2019-08-07 ENCOUNTER — Ambulatory Visit (INDEPENDENT_AMBULATORY_CARE_PROVIDER_SITE_OTHER): Payer: Self-pay | Admitting: Student

## 2019-08-07 ENCOUNTER — Other Ambulatory Visit: Payer: Self-pay

## 2019-08-07 ENCOUNTER — Other Ambulatory Visit (HOSPITAL_COMMUNITY)
Admission: RE | Admit: 2019-08-07 | Discharge: 2019-08-07 | Disposition: A | Discharge status: Home / Self Care | Payer: Self-pay | Source: Ambulatory Visit | Attending: Student | Admitting: Student

## 2019-08-07 VITALS — BP 123/62 | HR 100 | Wt 135.3 lb

## 2019-08-07 DIAGNOSIS — Z3A36 36 weeks gestation of pregnancy: Secondary | ICD-10-CM

## 2019-08-07 DIAGNOSIS — Z3493 Encounter for supervision of normal pregnancy, unspecified, third trimester: Secondary | ICD-10-CM | POA: Insufficient documentation

## 2019-08-07 NOTE — Progress Notes (Signed)
   PRENATAL VISIT NOTE  Subjective:  Monica Bryan is a 29 y.o. G2P1001 at [redacted]w[redacted]d being seen today for ongoing prenatal care.  She is currently monitored for the following issues for this low-risk pregnancy and has Insufficient prenatal care; Family history of diabetes mellitus (DM); Loss of weight; Cephalalgia; Esophageal reflux; Supervision of low-risk pregnancy; History of Bell's palsy; Headache; and Rubella non-immune status, antepartum on their problem list.  Patient reports no complaints. SHe has been taking her BP at home; all normal. .  Contractions: Irritability. Vag. Bleeding: None.  Movement: Present. Denies leaking of fluid.   The following portions of the patient's history were reviewed and updated as appropriate: allergies, current medications, past family history, past medical history, past social history, past surgical history and problem list.   Objective:   Vitals:   08/01/19 1558 08/06/19 1558 08/07/19 1545  BP: 104/68 105/65 123/62  Pulse: 94 (!) 104 100  Weight:   135 lb 4.8 oz (61.4 kg)    Fetal Status: Fetal Heart Rate (bpm): 165   Movement: Present  Presentation: Vertex  General:  Alert, oriented and cooperative. Patient is in no acute distress.  Skin: Skin is warm and dry. No rash noted.   Cardiovascular: Normal heart rate noted  Respiratory: Normal respiratory effort, no problems with respiration noted  Abdomen: Soft, gravid, appropriate for gestational age.  Pain/Pressure: Present     Pelvic: Cervical exam performed in the presence of a chaperone Dilation: Fingertip Effacement (%): Thick Station: -3  Extremities: Normal range of motion.  Edema: None  Mental Status: Normal mood and affect. Normal behavior. Normal judgment and thought content.   Assessment and Plan:  Pregnancy: G2P1001 at [redacted]w[redacted]d 1. Encounter for supervision of low-risk pregnancy in third trimester Reviewed patient's Blood pressure for last two weeks at home 100/60s; patient  encouraged to keep up the good work of checking her BP at home.  -Vertex  By [place] - GC/Chlamydia probe amp ([place])not at [facility] - Culture, beta strep (group b only)  Preterm labor symptoms and general obstetric precautions including but not limited to vaginal bleeding, contractions, leaking of fluid and fetal movement were reviewed in detail with the patient. Please refer to After Visit Summary for other counseling recommendations.   Return in about 1 week (around 08/14/2019), or LROB on telephone with interpreter.  No future appointments.  Marylene Land, CNM

## 2019-08-08 LAB — GC/CHLAMYDIA PROBE AMP (~~LOC~~) NOT AT ARMC
Chlamydia: NEGATIVE
Comment: NEGATIVE
Comment: NORMAL
Neisseria Gonorrhea: NEGATIVE

## 2019-08-10 LAB — CULTURE, BETA STREP (GROUP B ONLY): Strep Gp B Culture: POSITIVE — AB

## 2019-08-16 ENCOUNTER — Encounter: Payer: Self-pay | Admitting: Student

## 2019-08-16 DIAGNOSIS — B951 Streptococcus, group B, as the cause of diseases classified elsewhere: Secondary | ICD-10-CM | POA: Insufficient documentation

## 2019-08-22 ENCOUNTER — Encounter: Payer: Self-pay | Admitting: Obstetrics and Gynecology

## 2019-08-22 ENCOUNTER — Telehealth (INDEPENDENT_AMBULATORY_CARE_PROVIDER_SITE_OTHER): Payer: Self-pay | Admitting: Obstetrics and Gynecology

## 2019-08-22 VITALS — BP 118/77 | HR 109

## 2019-08-22 DIAGNOSIS — Z603 Acculturation difficulty: Secondary | ICD-10-CM

## 2019-08-22 DIAGNOSIS — Z3493 Encounter for supervision of normal pregnancy, unspecified, third trimester: Secondary | ICD-10-CM

## 2019-08-22 DIAGNOSIS — Z789 Other specified health status: Secondary | ICD-10-CM

## 2019-08-22 DIAGNOSIS — Z3483 Encounter for supervision of other normal pregnancy, third trimester: Secondary | ICD-10-CM

## 2019-08-22 DIAGNOSIS — Z3A39 39 weeks gestation of pregnancy: Secondary | ICD-10-CM

## 2019-08-22 DIAGNOSIS — O99820 Streptococcus B carrier state complicating pregnancy: Secondary | ICD-10-CM | POA: Insufficient documentation

## 2019-08-22 NOTE — Progress Notes (Signed)
   TELEHEALTH VIRTUAL OBSTETRICS VISIT ENCOUNTER NOTE  Clinic: [facility]  I connected with Monica Bryan on 08/22/19 at  3:35 PM EDT by telephone at home and verified that I am speaking with the correct person using two identifiers.   I discussed the limitations, risks, security and privacy concerns of performing an evaluation and management service by telephone and the availability of in person appointments. I also discussed with the patient that there may be a patient responsible charge related to this service. The patient expressed understanding and agreed to proceed.  Subjective:  Monica Bryan is a 29 y.o. G2P1001 at [redacted]w[redacted]d being followed for ongoing prenatal care.  She is currently monitored for the following issues for this low-risk pregnancy and has Insufficient prenatal care; Family history of diabetes mellitus (DM); Loss of weight; Cephalalgia; Esophageal reflux; Supervision of low-risk pregnancy; History of Bell's palsy; Headache; Rubella non-immune status, antepartum; Positive GBS test; and GBS (group B Streptococcus carrier), +RV culture, currently pregnant on their problem list.  Patient reports occasional contractions. Reports fetal movement. Denies any contractions, bleeding or leaking of fluid.   The following portions of the patient's history were reviewed and updated as appropriate: allergies, current medications, past family history, past medical history, past social history, past surgical history and problem list.   Objective:   Vitals:   08/22/19 1548  BP: 118/77  Pulse: (!) 109    Babyscripts Data Reviewed: not applicable  General:  Alert, oriented and cooperative.   Mental Status: Normal mood and affect perceived. Normal judgment and thought content.  Rest of physical exam deferred due to type of encounter  Assessment and Plan:  Pregnancy: G2P1001 at [redacted]w[redacted]d 1. Language barrier Interpreter used  2. Encounter  for supervision of low-risk pregnancy in third trimester Labor precautions given. Set up PDIOL nv, d/w pt  Preterm labor symptoms and general obstetric precautions including but not limited to vaginal bleeding, contractions, leaking of fluid and fetal movement were reviewed in detail with the patient.  I discussed the assessment and treatment plan with the patient. The patient was provided an opportunity to ask questions and all were answered. The patient agreed with the plan and demonstrated an understanding of the instructions. The patient was advised to call back or seek an in-person office evaluation/go to MAU at [facility] for any urgent or concerning symptoms. Please refer to After Visit Summary for other counseling recommendations.   I provided 10 minutes of non-face-to-face time during this encounter. The visit was conducted via telephone-medicine  Return for 7-10d , low risk, nst/bpp with diane, in person.  No future appointments.  Osmond Bing, MD [facility] [employer], [facility]

## 2019-08-27 ENCOUNTER — Encounter (HOSPITAL_COMMUNITY): Payer: Self-pay | Admitting: Obstetrics & Gynecology

## 2019-08-27 ENCOUNTER — Inpatient Hospital Stay (HOSPITAL_COMMUNITY)
Admission: AD | Admit: 2019-08-27 | Discharge: 2019-08-27 | Disposition: A | Discharge status: Home / Self Care | Payer: Self-pay | Attending: Obstetrics & Gynecology | Admitting: Obstetrics & Gynecology

## 2019-08-27 ENCOUNTER — Inpatient Hospital Stay (HOSPITAL_COMMUNITY)
Admission: RE | Admit: 2019-08-27 | Discharge: 2019-08-30 | DRG: 807 | Disposition: A | Discharge status: Home / Self Care | Payer: Medicaid Other | Attending: Obstetrics & Gynecology | Admitting: Obstetrics & Gynecology

## 2019-08-27 ENCOUNTER — Other Ambulatory Visit: Payer: Self-pay

## 2019-08-27 DIAGNOSIS — Z3A39 39 weeks gestation of pregnancy: Secondary | ICD-10-CM

## 2019-08-27 DIAGNOSIS — Z8669 Personal history of other diseases of the nervous system and sense organs: Secondary | ICD-10-CM

## 2019-08-27 DIAGNOSIS — Z283 Underimmunization status: Secondary | ICD-10-CM

## 2019-08-27 DIAGNOSIS — O479 False labor, unspecified: Secondary | ICD-10-CM

## 2019-08-27 DIAGNOSIS — Z20822 Contact with and (suspected) exposure to covid-19: Secondary | ICD-10-CM | POA: Diagnosis present

## 2019-08-27 DIAGNOSIS — O99824 Streptococcus B carrier state complicating childbirth: Secondary | ICD-10-CM | POA: Diagnosis present

## 2019-08-27 DIAGNOSIS — Z3493 Encounter for supervision of normal pregnancy, unspecified, third trimester: Secondary | ICD-10-CM

## 2019-08-27 DIAGNOSIS — Z3490 Encounter for supervision of normal pregnancy, unspecified, unspecified trimester: Secondary | ICD-10-CM

## 2019-08-27 DIAGNOSIS — O471 False labor at or after 37 completed weeks of gestation: Secondary | ICD-10-CM | POA: Insufficient documentation

## 2019-08-27 DIAGNOSIS — O09899 Supervision of other high risk pregnancies, unspecified trimester: Secondary | ICD-10-CM

## 2019-08-27 DIAGNOSIS — O99820 Streptococcus B carrier state complicating pregnancy: Secondary | ICD-10-CM

## 2019-08-27 DIAGNOSIS — O26893 Other specified pregnancy related conditions, third trimester: Secondary | ICD-10-CM | POA: Diagnosis present

## 2019-08-27 DIAGNOSIS — O0930 Supervision of pregnancy with insufficient antenatal care, unspecified trimester: Secondary | ICD-10-CM

## 2019-08-27 LAB — CBC
HCT: 39.9 % (ref 36.0–46.0)
Hemoglobin: 13.3 g/dL (ref 12.0–15.0)
MCH: 31.8 pg (ref 26.0–34.0)
MCHC: 33.3 g/dL (ref 30.0–36.0)
MCV: 95.5 fL (ref 80.0–100.0)
Platelets: 207 10*3/uL (ref 150–400)
RBC: 4.18 MIL/uL (ref 3.87–5.11)
RDW: 12.9 % (ref 11.5–15.5)
WBC: 8.8 10*3/uL (ref 4.0–10.5)
nRBC: 0 % (ref 0.0–0.2)

## 2019-08-27 LAB — TYPE AND SCREEN
ABO/RH(D): O POS
Antibody Screen: NEGATIVE

## 2019-08-27 LAB — SARS CORONAVIRUS 2 BY RT PCR (HOSPITAL ORDER, PERFORMED IN ~~LOC~~ HOSPITAL LAB): SARS Coronavirus 2: NEGATIVE

## 2019-08-27 MED ORDER — SODIUM CHLORIDE 0.9 % IV SOLN
5.0000 10*6.[IU] | Freq: Once | INTRAVENOUS | Status: AC
  Administered 2019-08-27: 5 10*6.[IU] via INTRAVENOUS
  Filled 2019-08-27: qty 5

## 2019-08-27 MED ORDER — SOD CITRATE-CITRIC ACID 500-334 MG/5ML PO SOLN: 30.0000 mL | ORAL | Status: DC | PRN

## 2019-08-27 MED ORDER — OXYTOCIN-SODIUM CHLORIDE 30-0.9 UT/500ML-% IV SOLN: 2.5000 [IU]/h | INTRAVENOUS | Status: DC

## 2019-08-27 MED ORDER — TERBUTALINE SULFATE 1 MG/ML IJ SOLN: 0.2500 mg | Freq: Once | INTRAMUSCULAR | Status: DC | PRN

## 2019-08-27 MED ORDER — ONDANSETRON HCL 4 MG/2ML IJ SOLN: 4.0000 mg | Freq: Four times a day (QID) | INTRAMUSCULAR | Status: DC | PRN

## 2019-08-27 MED ORDER — LACTATED RINGERS IV SOLN: 500.0000 mL | INTRAVENOUS | Status: DC | PRN

## 2019-08-27 MED ORDER — PENICILLIN G POT IN DEXTROSE 60000 UNIT/ML IV SOLN
3.0000 10*6.[IU] | INTRAVENOUS | Status: DC
  Filled 2019-08-27: qty 50

## 2019-08-27 MED ORDER — LIDOCAINE HCL (PF) 1 % IJ SOLN: 30.0000 mL | INTRAMUSCULAR | Status: DC | PRN

## 2019-08-27 MED ORDER — LACTATED RINGERS IV SOLN: INTRAVENOUS | Status: DC

## 2019-08-27 MED ORDER — ACETAMINOPHEN 325 MG PO TABS: 650.0000 mg | ORAL_TABLET | ORAL | Status: DC | PRN

## 2019-08-27 MED ORDER — OXYTOCIN BOLUS FROM INFUSION
500.0000 mL | Freq: Once | INTRAVENOUS | Status: AC
  Administered 2019-08-28: 500 mL via INTRAVENOUS

## 2019-08-27 MED ORDER — OXYTOCIN-SODIUM CHLORIDE 30-0.9 UT/500ML-% IV SOLN
1.0000 m[IU]/min | INTRAVENOUS | Status: DC
  Administered 2019-08-27: 2 m[IU]/min via INTRAVENOUS
  Filled 2019-08-27: qty 500

## 2019-08-27 MED ORDER — FENTANYL CITRATE (PF) 100 MCG/2ML IJ SOLN
100.0000 ug | INTRAMUSCULAR | Status: DC | PRN
  Administered 2019-08-28 (×2): 100 ug via INTRAVENOUS
  Filled 2019-08-27 (×3): qty 2

## 2019-08-27 NOTE — Progress Notes (Signed)
Vertex presentation confirmed with bedside ultrasound.   Clayton Bibles, MSN, CNM Certified Nurse Midwife, [facility] for [employer], [facility] 08/27/19 9:21 PM

## 2019-08-27 NOTE — MAU Note (Signed)
Contractions every 5 min since early morning and blood tinge mucus.  Baby moving well. No leaking.

## 2019-08-27 NOTE — Discharge Instructions (Signed)
Contracciones de Braxton Hicks °Braxton Hicks Contractions °Las contracciones del útero pueden presentarse durante todo el embarazo, pero no siempre indican que la mujer está de parto. Es posible que usted haya tenido contracciones de práctica llamadas "contracciones de Braxton Hicks". A veces, se las confunde con el parto real. °¿Qué son las contracciones de Braxton Hicks? °Las contracciones de Braxton Hicks son espasmos que se producen en los músculos del útero antes del parto. A diferencia de las contracciones del parto verdadero, estas no producen el agrandamiento (la dilatación) ni el afinamiento del cuello uterino. Hacia el final del embarazo (entre las semanas 32 y 34), las contracciones de Braxton Hicks pueden presentarse más seguido y tornarse más intensas. A veces, resulta difícil distinguirlas del parto verdadero porque pueden ser muy molestas. No debe sentirse avergonzada si concurre al hospital con falso parto. °En ocasiones, la única forma de saber si el trabajo de parto es verdadero es que el médico determine si hay cambios en el cuello del útero. El médico le hará un examen físico y quizás le controle las contracciones. Si usted no está de parto verdadero, el examen debe indicar que el cuello uterino no está dilatado y que usted no ha roto bolsa. °Si no hay otros problemas de salud asociados con su embarazo, no habrá inconvenientes si la envían a su casa con un falso parto. Es posible que las contracciones de Braxton Hicks continúen hasta que se desencadene el parto verdadero. °Cómo diferenciar el trabajo de parto falso del verdadero °Trabajo de parto verdadero °· Las contracciones duran de 30 a 70 segundos. °· Las contracciones pueden tornarse muy regulares. °· La molestia generalmente se siente en la parte superior del útero y se extiende hacia la zona baja del abdomen y hacia la cintura. °· Las contracciones no desaparecen cuando usted camina. °· Las contracciones generalmente se hacen más  intensas y aumentan en frecuencia. °· El cuello uterino se dilata y se afina. °Parto falso °· En general, las contracciones son más cortas y no tan intensas como las del parto verdadero. °· En general, las contracciones son irregulares. °· A menudo, las contracciones se sienten en la parte delantera de la parte baja del abdomen y en la ingle. °· Las contracciones pueden desaparecer cuando usted camina o cambia de posición mientras está acostada. °· Las contracciones se vuelven más débiles y su duración es menor a medida que transcurre el tiempo. °· En general, el cuello uterino no se dilata ni se afina. °Siga estas indicaciones en su casa: ° °· Tome los medicamentos de venta libre y los recetados solamente como se lo haya indicado el médico. °· Continúe haciendo los ejercicios habituales y siga las demás indicaciones que el médico le dé. °· Coma y beba con moderación si cree que está de parto. °· Si las contracciones de Braxton Hicks le provocan incomodidad: °? Cambie de posición: si está acostada o descansando, camine; si está caminando, descanse. °? Siéntese y descanse en una bañera con agua tibia. °? Beba suficiente líquido como para mantener la orina de color amarillo pálido. La deshidratación puede provocar contracciones. °? Respire lenta y profundamente varias veces por hora. °· Vaya a todas las visitas de control prenatales y de control como se lo haya indicado el médico. Esto es importante. °Comuníquese con un médico si: °· Tiene fiebre. °· Siente dolor constante en el abdomen. °Solicite ayuda de inmediato si: °· Las contracciones se intensifican, se hacen más regulares y cercanas entre sí. °· Tiene una pérdida de líquido por la vagina. °· Elimina   una mucosidad sanguinolenta (pérdida del tapón mucoso). °· Tiene una hemorragia vaginal. °· Tiene un dolor en la zona lumbar que nunca tuvo antes. °· Siente que la cabeza del bebé empuja hacia abajo y ejerce presión en la zona pélvica. °· El bebé no se mueve tanto  como antes. °Resumen °· Las contracciones que se presentan antes del parto se conocen como contracciones de Braxton Hicks, falso parto o contracciones de práctica. °· En general, las contracciones de Braxton Hicks son más cortas, más débiles, con más tiempo entre una y otra, y menos regulares que las contracciones del parto verdadero. Las contracciones del parto verdadero se intensifican progresivamente y se tornan regulares y más frecuentes. °· Para controlar la molestia que producen las contracciones de Braxton Hicks, puede cambiar de posición, darse un baño templado y descansar, beber mucha agua o practicar la respiración profunda. °Esta información no tiene como fin reemplazar el consejo del médico. Asegúrese de hacerle al médico cualquier pregunta que tenga. °Document Revised: 06/10/2017 Document Reviewed: 10/11/2016 °Elsevier Patient Education © 2020 Elsevier Inc. ° °

## 2019-08-27 NOTE — MAU Note (Signed)
Returning for increased contractions, unsure how far apart.  Denies LOF.  Some spotting when she wipes.  Endorses + FM during contractions.  1.5 cm this morning.

## 2019-08-27 NOTE — MAU Provider Note (Signed)
S: Ms. Monica Bryan is a 29 y.o. G2P1001 at [redacted]w[redacted]d  who presents to MAU today complaining contractions q 5-10 minutes since yesterday afternoon. She denies vaginal bleeding. She denies LOF. She reports normal fetal movement.    O: BP 108/71 (BP Location: Right Arm)   Pulse 87   Temp 98.8 F (37.1 C) (Oral)   Resp 18   Ht 5\' 1"  (1.549 m)   Wt 60.8 kg   LMP  (LMP Unknown)   SpO2 98%   BMI 25.32 kg/m  GENERAL: Well-developed, well-nourished female in no acute distress.  HEAD: Normocephalic, atraumatic.  CHEST: Normal effort of breathing, regular heart rate ABDOMEN: Soft, nontender, gravid  Cervical exam:  Dilation: 1.5 Effacement (%): 50 Cervical Position: Posterior Station: -3 Presentation: Vertex Exam by:: Holly Flippin RN    Fetal Monitoring: Baseline: 135 Variability: moderate Accelerations: 15x15 Decelerations: none Contractions: 5-10   A: SIUP at [redacted]w[redacted]d  False labor  P: -Discharge home in stable condition -Labor precautions discussed -Patient advised to follow-up with [facility] as scheduled for prenatal care -Patient may return to MAU as needed or if her condition were to change or worsen   [redacted]w[redacted]d, Rolm Bookbinder 08/27/2019 7:55 AM

## 2019-08-27 NOTE — MAU Note (Signed)
Presents with ctxs every 8-9 minutes apart.  States ctxs have gotten stronger since discharge home this morning.  Reports bloody discharge with wiping.  +FM.

## 2019-08-27 NOTE — H&P (Signed)
OBSTETRIC ADMISSION HISTORY AND PHYSICAL  Monica Bryan is a 29 y.o. female G2P1001 with IUP at [redacted]w[redacted]d by 22 wk [place] presenting for SOL. She has been contracting since early this morning with light spotting. She reports +FMs, No LOF, no blurry vision, headaches or peripheral edema, and RUQ pain.  She plans on breast feeding. She requests Depo for birth control. She [facility] her [facility] at [facility]   Dating: By 22 wk [place] --->  Estimated Date of Delivery: 08/29/19  Sono: 05/23/2019   @[redacted]w[redacted]d , CWD, normal anatomy, cephalic presentation, anterior placental lie, 949g, 62% EFW   Prenatal History/Complications: Late/Insufficient PNC Dating by 22 wk GBS pos Rubella non-immune  Past Medical History: Past Medical History:  Diagnosis Date  . Chlamydia   . GERD (gastroesophageal reflux disease)   . Headache   . History of Bell's palsy   . No pertinent past medical history     Past Surgical History: Past Surgical History:  Procedure Laterality Date  . NO PAST SURGERIES      Obstetrical History: OB History    Gravida  2   Para  1   Term  1   Preterm      AB      Living  1     SAB      TAB      Ectopic      Multiple      Live Births  1           Social History: Social History   Socioeconomic History  . Marital status: Single    Spouse name: Not on file  . Number of children: Not on file  . Years of education: Not on file  . Highest education level: Not on file  Occupational History  . Not on file  Tobacco Use  . Smoking status: Never Smoker  . Smokeless tobacco: Never Used  Vaping Use  . Vaping Use: Never used  Substance and Sexual Activity  . Alcohol use: No    Alcohol/week: 0.0 standard drinks  . Drug use: No  . Sexual activity: Yes    Birth control/protection: None  Other Topics Concern  . Not on file  Social History Narrative  . Not on file   Social Determinants of Health   Financial Resource Strain:   . Difficulty of  Paying Living Expenses:   Food Insecurity: Food Insecurity Present  . Worried About [place] in the Last Year: Sometimes true  . Ran Out of Food in the Last Year: Never true  Transportation Needs: No Transportation Needs  . Lack of Transportation (Medical): No  . Lack of Transportation (Non-Medical): No  Physical Activity:   . Days of Exercise per Week:   . Minutes of Exercise per Session:   Stress:   . Feeling of Stress :   Social Connections:   . Frequency of Communication with Friends and Family:   . Frequency of Social Gatherings with Friends and Family:   . Attends Religious Services:   . Active Member of Clubs or Organizations:   . Attends Programme researcher, broadcasting/film/video Meetings:   Banker Marital Status:     Family History: Family History  Problem Relation Age of Onset  . Diabetes Maternal Aunt   . Heart disease Maternal Grandmother   . Other Neg Hx     Allergies: No Known Allergies  Medications Prior to Admission  Medication Sig Dispense Refill Last Dose  . Prenatal Vit-Fe Fumarate-FA (  PRENATAL VITAMINS PO) Take 1 tablet by mouth daily.   Past Week at Unknown time     Review of Systems   All systems reviewed and negative except as stated in HPI  Blood pressure 121/75, pulse 97, temperature 98.4 F (36.9 C), temperature source Oral, resp. rate 18, height 5\' 2"  (1.575 m), weight 63 kg, SpO2 100 %, unknown if currently breastfeeding. General appearance: alert, cooperative, appears stated age and no distress Lungs: normal effort Heart: regular rate  Abdomen: soft, non-tender; bowel sounds normal Pelvic: gravid uterus GU: No vaginal lesions  Extremities: Homans sign is negative, no sign of DVT Presentation: cephalic Fetal monitoringBaseline: 145 bpm, Variability: Good {> 6 bpm), Accelerations: Reactive and Decelerations: Absent Uterine activity: Frequency: Every 3-5 minutes Dilation: 3 Effacement (%): 60, 70 Station: -3 Exam by:: Rodger Giangregorio, MD   Prenatal  labs: ABO, Rh: --/--/O POS, O POS Performed at [facility], [address] [place], [place] [address]  [phone] 2110) Antibody: NEG (06/14 2110) Rubella: <0.90 (02/24 1613) RPR: Non Reactive (04/01 0823)  HBsAg: Negative (02/24 1613)  HIV: Non Reactive (04/01 2355)  GBS: Positive/-- (05/25 1549)  2 hr Glucola WNL Genetic screening  Low risk NIPS Anatomy US WNL  Prenatal Transfer Tool  Maternal Diabetes: No Genetic Screening: Normal Maternal Ultrasounds/Referrals: Normal Fetal Ultrasounds or other Referrals:  None Maternal Substance Abuse:  No Significant Maternal Medications:  None Significant Maternal Lab Results: Group B Strep positive  Results for orders placed or performed during the hospital encounter of 08/27/19 (from the past 24 hour(s))  CBC   Collection Time: 08/27/19  9:10 PM  Result Value Ref Range   WBC 8.8 4.0 - 10.5 K/uL   RBC 4.18 3.87 - 5.11 MIL/uL   Hemoglobin 13.3 12.0 - 15.0 g/dL   HCT 39.9 36 - 46 %   MCV 95.5 80.0 - 100.0 fL   MCH 31.8 26.0 - 34.0 pg   MCHC 33.3 30.0 - 36.0 g/dL   RDW 12.9 11.5 - 15.5 %   Platelets 207 150 - 400 K/uL   nRBC 0.0 0.0 - 0.2 %  Type and screen [place]   Collection Time: 08/27/19  9:10 PM  Result Value Ref Range   ABO/RH(D) O POS    Antibody Screen NEG    Sample Expiration      08/30/2019,2359 Performed at [facility], [place]. [address] [place], [place] [address]   ABO/Rh   Collection Time: 08/27/19  9:10 PM  Result Value Ref Range   ABO/RH(D)      O POS Performed at [place] [address], [place], [place] [address]     Patient Active Problem List   Diagnosis Date Noted  . Labor and delivery, indication for care 08/27/2019  . GBS (group B Streptococcus carrier), +RV culture, currently pregnant 08/22/2019  . Positive GBS test 08/16/2019  . Rubella non-immune status, antepartum 05/10/2019  . Supervision of low-risk pregnancy 05/02/2019  . History of Bell's palsy    . Headache   . Family history of diabetes mellitus (DM) 02/15/2014  . Loss of weight 02/15/2014  . Cephalalgia 02/15/2014  . Esophageal reflux 02/15/2014  . Insufficient prenatal care 12/22/2011    Assessment/Plan:  Sanai Frick Elberta Leatherwood is a 29 y.o. G2P1001 at [redacted]w[redacted]d here for SOL at term.  #Labor:Vertex by exam. Will start Pitocin. Consider AROM after adequate GBS ppx. Anticipate SVD.  #Pain: Per patient request #FWB: Cat I; EFW: 3600g #  ID:  GBS pos; PCN ordered #MOF: breast  #MOC: Depo outpatient  #Circ:  declines  Jerilynn Birkenhead, MD [facility] Family Medicine Fellow, [facility] for [facility], [facility] 08/27/2019, 10:36 PM

## 2019-08-28 ENCOUNTER — Encounter (HOSPITAL_COMMUNITY): Payer: Self-pay | Admitting: Obstetrics & Gynecology

## 2019-08-28 DIAGNOSIS — O99824 Streptococcus B carrier state complicating childbirth: Principal | ICD-10-CM

## 2019-08-28 DIAGNOSIS — Z3A39 39 weeks gestation of pregnancy: Secondary | ICD-10-CM

## 2019-08-28 LAB — RPR: RPR Ser Ql: NONREACTIVE

## 2019-08-28 LAB — ABO/RH: ABO/RH(D): O POS

## 2019-08-28 MED ORDER — ONDANSETRON HCL 4 MG PO TABS: 4.0000 mg | ORAL_TABLET | ORAL | Status: DC | PRN

## 2019-08-28 MED ORDER — SIMETHICONE 80 MG PO CHEW: 80.0000 mg | CHEWABLE_TABLET | ORAL | Status: DC | PRN

## 2019-08-28 MED ORDER — TETANUS-DIPHTH-ACELL PERTUSSIS 5-2.5-18.5 LF-MCG/0.5 IM SUSP: 0.5000 mL | Freq: Once | INTRAMUSCULAR | Status: DC

## 2019-08-28 MED ORDER — DIBUCAINE (PERIANAL) 1 % EX OINT
1.0000 "application " | TOPICAL_OINTMENT | CUTANEOUS | Status: DC | PRN
  Filled 2019-08-28: qty 28

## 2019-08-28 MED ORDER — IBUPROFEN 600 MG PO TABS
600.0000 mg | ORAL_TABLET | Freq: Three times a day (TID) | ORAL | Status: DC | PRN
  Administered 2019-08-28 – 2019-08-30 (×7): 600 mg via ORAL
  Filled 2019-08-28 (×7): qty 1

## 2019-08-28 MED ORDER — SENNOSIDES-DOCUSATE SODIUM 8.6-50 MG PO TABS
2.0000 | ORAL_TABLET | ORAL | Status: DC
  Administered 2019-08-28 – 2019-08-30 (×2): 2 via ORAL
  Filled 2019-08-28 (×3): qty 2

## 2019-08-28 MED ORDER — ONDANSETRON HCL 4 MG/2ML IJ SOLN: 4.0000 mg | INTRAMUSCULAR | Status: DC | PRN

## 2019-08-28 MED ORDER — WITCH HAZEL-GLYCERIN EX PADS: 1.0000 "application " | MEDICATED_PAD | CUTANEOUS | Status: DC | PRN

## 2019-08-28 MED ORDER — PRENATAL MULTIVITAMIN CH
1.0000 | ORAL_TABLET | Freq: Every day | ORAL | Status: DC
  Administered 2019-08-28 – 2019-08-30 (×3): 1 via ORAL
  Filled 2019-08-28 (×3): qty 1

## 2019-08-28 MED ORDER — BENZOCAINE-MENTHOL 20-0.5 % EX AERO
1.0000 "application " | INHALATION_SPRAY | CUTANEOUS | Status: DC | PRN
  Filled 2019-08-28: qty 56

## 2019-08-28 MED ORDER — ACETAMINOPHEN 325 MG PO TABS
650.0000 mg | ORAL_TABLET | Freq: Four times a day (QID) | ORAL | Status: DC | PRN
  Administered 2019-08-28 – 2019-08-29 (×4): 650 mg via ORAL
  Filled 2019-08-28 (×4): qty 2

## 2019-08-28 MED ORDER — DIPHENHYDRAMINE HCL 25 MG PO CAPS: 25.0000 mg | ORAL_CAPSULE | Freq: Four times a day (QID) | ORAL | Status: DC | PRN

## 2019-08-28 MED ORDER — MEASLES, MUMPS & RUBELLA VAC IJ SOLR
0.5000 mL | Freq: Once | INTRAMUSCULAR | Status: AC
  Administered 2019-08-30: 0.5 mL via SUBCUTANEOUS
  Filled 2019-08-28: qty 0.5

## 2019-08-28 MED ORDER — COCONUT OIL OIL: 1.0000 "application " | TOPICAL_OIL | Status: DC | PRN

## 2019-08-28 NOTE — Discharge Summary (Signed)
**Note De-Identified Monica Obfuscation** Postpartum Discharge Summary     Patient Name: Monica Bryan DOB: 07-16-1990 MRN: [id number]  Date of admission: 08/27/2019 Delivery date:08/28/2019  Delivering provider: Chauncey Mann  Date of discharge: 08/29/2019  Admitting diagnosis: Labor and delivery, indication for care [O75.9] Intrauterine pregnancy: [redacted]w[redacted]d    Secondary diagnosis:  Active Problems:   Insufficient prenatal care   Rubella non-immune status, antepartum   GBS (group B Streptococcus carrier), +RV culture, currently pregnant   Labor and delivery, indication for care  Additional problems: None    Discharge diagnosis: Term Pregnancy Delivered                                              Post partum procedures:None Augmentation: AROM and Pitocin Complications: None  Hospital course: Onset of Labor With Vaginal Delivery      29y.o. yo G2P1001 at 365w6das admitted in Latent Labor on 08/27/2019. Patient had an uncomplicated labor course as follows: Initial SVE: 2.5/50/-3. Patient received Pitocin and AROM. She then progressed to complete.  Membrane Rupture Time/Date: 1:38 AM ,08/28/2019   Delivery Method:Vaginal, Spontaneous  Episiotomy: None  Lacerations:  None  Patient had an uncomplicated postpartum course.  She is ambulating, tolerating a regular diet, passing flatus, and urinating well. Patient is discharged home in stable condition on 08/29/19.  Newborn Data: Birth date:08/28/2019  Birth time:2:35 AM  Gender:Female  Living status:Living  Apgars:8 ,9  Weight:3646 g   Magnesium Sulfate received: No BMZ received: No Rhophylac:N/A MMR:Yes  (ordered prior to DC and discussed) T-DaP:Given prenatally Flu: No Transfusion:No  Physical exam  Vitals:   08/28/19 1248 08/28/19 1653 08/28/19 2204 08/29/19 0551  BP: 102/80 110/84 110/83 102/70  Pulse: 95 90 82 81  Resp: 17 17 18 18   Temp: 98.6 F (37 C) 98.4 F (36.9 C) 98.3 F (36.8 C) 98.1 F (36.7 C)  TempSrc: Axillary Oral Oral Oral   SpO2: 100% 99% 98% 99%  Weight:      Height:       General: alert, cooperative and no distress Lochia: appropriate Uterine Fundus: firm Incision: N/A DVT Evaluation: No evidence of DVT seen on physical exam. Labs: Lab Results  Component Value Date   WBC 8.8 08/27/2019   HGB 13.3 08/27/2019   HCT 39.9 08/27/2019   MCV 95.5 08/27/2019   PLT 207 08/27/2019   CMP Latest Ref Rng & Units 02/15/2014  Glucose 70 - 99 mg/dL 96  BUN 6 - 23 mg/dL 16  Creatinine 0.50 - 1.10 mg/dL 0.56  Sodium 135 - 145 mEq/L 140  Potassium 3.5 - 5.3 mEq/L 5.5(H)  Chloride 96 - 112 mEq/L 105  CO2 19 - 32 mEq/L 27  Calcium 8.4 - 10.5 mg/dL 9.8  Total Protein 6.0 - 8.3 g/dL 7.2  Total Bilirubin 0.2 - 1.2 mg/dL 0.4  Alkaline Phos 39 - 117 U/L 59  AST 0 - 37 U/L 28  ALT 0 - 35 U/L 34   Edinburgh Score: Edinburgh Postnatal Depression Scale Screening Tool 08/28/2019  I have been able to laugh and see the funny side of things. 0  I have looked forward with enjoyment to things. 0  I have blamed myself unnecessarily when things went wrong. 0  I have been anxious or worried for no good reason. 0  I have felt scared or panicky for no good  reason. 0  Things have been getting on top of me. 0  I have been so unhappy that I have had difficulty sleeping. 0  I have felt sad or miserable. 0  I have been so unhappy that I have been crying. 0  The thought of harming myself has occurred to me. 0  Edinburgh Postnatal Depression Scale Total 0     After visit meds:  Allergies as of 08/29/2019   No Known Allergies     Medication List    TAKE these medications   acetaminophen 325 MG tablet Commonly known as: Tylenol Take 2 tablets (650 mg total) by mouth every 6 (six) hours as needed (for pain scale < 4).   ibuprofen 600 MG tablet Commonly known as: ADVIL Take 1 tablet (600 mg total) by mouth every 8 (eight) hours as needed for mild pain.   PRENATAL VITAMINS PO Take 1 tablet by mouth daily.    senna-docusate 8.6-50 MG tablet Commonly known as: Senokot-S Take 2 tablets by mouth daily. Start taking on: August 30, 2019        Discharge home in stable condition Infant Feeding: Breast Infant Disposition:home with mother Discharge instruction: per After Visit Summary and Postpartum booklet. Activity: Advance as tolerated. Pelvic rest for 6 weeks.  Diet: routine diet Future Appointments: Future Appointments  Date Time Provider [place]  10/01/2019  2:15 PM Burleson, Rona Ravens, NP [facility]   Follow up Visit:   Please schedule this patient for a In person postpartum visit in 4 weeks with the following provider: Any provider. Additional Postpartum F/U:None  Low risk pregnancy complicated by: None Delivery mode:  Vaginal, Spontaneous  Anticipated Birth Control:  Depo at [facility] visit   08/29/2019 Chauncey Mann, MD

## 2019-08-28 NOTE — Progress Notes (Signed)
Labor Progress Note Jerrilyn Messinger Dubon Felipa Furnace is a 29 y.o. G2P1001 at [redacted]w[redacted]d presented for early labor at term. S: Feeling some pressure with ctx. Ctx much more painful than before. Desires AROM if possible.   O:  BP 121/87   Pulse 88   Temp 98 F (36.7 C) (Oral)   Resp 18   Ht 5\' 2"  (1.575 m)   Wt 63 kg   LMP  (LMP Unknown)   SpO2 100%   BMI 25.42 kg/m  EFM: 145, moderate variability, pos accels, no decels, reactive TOCO: q60m  CVE: Dilation: 6 Effacement (%): 80 Cervical Position: Posterior Station: -2 Presentation: Vertex Exam by:: Anamika Kueker, MD   A&P: 29 y.o. G2P1001 [redacted]w[redacted]d here for early labor at term. #Labor: Progressing well. Pit only at 4. AROM with clear fluid at this exam. Anticipate SVD soon.  #Pain: labor support without medications currently as patient desires #FWB: Cat I #GBS positive; s/p adequate PCN ppx  [redacted]w[redacted]d, MD 1:50 AM

## 2019-08-28 NOTE — Progress Notes (Signed)
I ordered pt meals, check her needs, by Orlan Leavens Spanish Medical Interpreter.

## 2019-08-29 ENCOUNTER — Encounter: Payer: Self-pay | Admitting: Women's Health

## 2019-08-29 ENCOUNTER — Other Ambulatory Visit: Payer: Self-pay

## 2019-08-29 MED ORDER — SENNOSIDES-DOCUSATE SODIUM 8.6-50 MG PO TABS: 2.0000 | ORAL_TABLET | ORAL | 0 refills | Status: DC

## 2019-08-29 MED ORDER — IBUPROFEN 600 MG PO TABS: 600.0000 mg | ORAL_TABLET | Freq: Three times a day (TID) | ORAL | 0 refills | Status: DC | PRN

## 2019-08-29 MED ORDER — ACETAMINOPHEN 325 MG PO TABS: 650.0000 mg | ORAL_TABLET | Freq: Four times a day (QID) | ORAL | 0 refills | Status: DC | PRN

## 2019-08-29 NOTE — Lactation Note (Addendum)
This note was copied from a baby's chart. Lactation Consultation Note Baby 38 hrs old. Used Spanish Interpreter [phone] Madelyn Brunner has a 28 yr old that she BF for 2 yrs Mom just wanted a refresher on BF.  Baby is cluster feeding. Explained why babies cluster feed. Mom has everted nipples w/postional stripes.  Noted baby pulling on breast and suckling on just nipple. Encouraged mom to keep baby closer, not seeing the mouth cheeks to breast.  Discussed positions, support, and props. LC placed pillows under mom's arms. Mom feeding in cradle w/body long ways instead in across her abd. Placed baby in football hold to get deeper latch since mom is sore. Mom stated felt better.  Hand expression demonstrated w/easily expressed colostrum. Praised mom. Newborn behavior, STS, I&O, supply and demand discussed. Mom encouraged to feed baby 8-12 times/24 hours and with feeding cues.   Encouraged mom to call for assistance or questions. Lactation brochure given to mom in Spanish.  Patient Name: Monica Bryan NIOEV'O Date: 08/29/2019 Reason for consult: Initial assessment;Term   Maternal Data Has patient been taught Hand Expression?: Yes Does the patient have breastfeeding experience prior to this delivery?: Yes  Feeding Feeding Type: Breast Fed  LATCH Score Latch: Grasps breast easily, tongue down, lips flanged, rhythmical sucking.  Audible Swallowing: A few with stimulation  Type of Nipple: Everted at rest and after stimulation  Comfort (Breast/Nipple): Filling, red/small blisters or bruises, mild/mod discomfort (bruising)  Hold (Positioning): Assistance needed to correctly position infant at breast and maintain latch.  LATCH Score: 7  Interventions Interventions: Breast feeding basics reviewed;Support pillows;Assisted with latch;Position options;Breast massage;Hand express;Breast compression;Adjust position  Lactation Tools Discussed/Used WIC Program: No   Consult  Status Consult Status: Complete Date: 08/29/19    Charyl Dancer 08/29/2019, 10:53 PM

## 2019-08-30 NOTE — Discharge Summary (Signed)
Postpartum Discharge Summary     Patient Name: Monica Bryan DOB: [birth year] MRN: [id number]  Date of admission: 08/27/2019 Delivery date:08/28/2019  Delivering provider: Chauncey Mann  Date of discharge: 08/30/2019  Admitting diagnosis: Labor and delivery, indication for care [O75.9] Intrauterine pregnancy: [redacted]w[redacted]d    Secondary diagnosis:  Active Problems:   Insufficient prenatal care   Rubella non-immune status, antepartum   GBS (group B Streptococcus carrier), +RV culture, currently pregnant   Labor and delivery, indication for care  Additional problems: None    Discharge diagnosis: Term Pregnancy Delivered                                              Post partum procedures:None Augmentation: AROM and Pitocin Complications: None  Hospital course: Onset of Labor With Vaginal Delivery      29y.o. yo G2P1001 at 374w6das admitted in Latent Labor on 08/27/2019. Patient had an uncomplicated labor course as follows: Initial SVE: 2.5/50/-3. Patient received Pitocin and AROM. She then progressed to complete.  Membrane Rupture Time/Date: 1:38 AM ,08/28/2019   Delivery Method:Vaginal, Spontaneous  Episiotomy: None  Lacerations:  None  Patient had an uncomplicated postpartum course. Patient stayed until PPD#2 for infant bilirubin concerns. She is ambulating, tolerating a regular diet, passing flatus, and urinating well. Patient is discharged home in stable condition on 08/30/19.  Newborn Data: Birth date:08/28/2019  Birth time:2:35 AM  Gender:Female  Living status:Living  Apgars:8 ,9  Weight:3646 g   Magnesium Sulfate received: No BMZ received: No Rhophylac:N/A MMR:Yes  (ordered prior to DC and discussed) T-DaP:Given prenatally Flu: No Transfusion:No  Physical exam  Vitals:   08/29/19 0551 08/29/19 1409 08/29/19 2159 08/30/19 0550  BP: 102/70 115/71 106/74 104/76  Pulse: 81 90 94 92  Resp: 18 17 16    Temp: 98.1 F (36.7 C) 98.2 F (36.8 C) 98.4 F (36.9  C) 98.1 F (36.7 C)  TempSrc: Oral Oral Oral Oral  SpO2: 99% 98% 98% 98%  Weight:      Height:       General: alert, cooperative and no distress Lochia: appropriate Uterine Fundus: firm Incision: N/A DVT Evaluation: No evidence of DVT seen on physical exam. Labs: Lab Results  Component Value Date   WBC 8.8 08/27/2019   HGB 13.3 08/27/2019   HCT 39.9 08/27/2019   MCV 95.5 08/27/2019   PLT 207 08/27/2019   CMP Latest Ref Rng & Units 02/15/2014  Glucose 70 - 99 mg/dL 96  BUN 6 - 23 mg/dL 16  Creatinine 0.50 - 1.10 mg/dL 0.56  Sodium 135 - 145 mEq/L 140  Potassium 3.5 - 5.3 mEq/L 5.5(H)  Chloride 96 - 112 mEq/L 105  CO2 19 - 32 mEq/L 27  Calcium 8.4 - 10.5 mg/dL 9.8  Total Protein 6.0 - 8.3 g/dL 7.2  Total Bilirubin 0.2 - 1.2 mg/dL 0.4  Alkaline Phos 39 - 117 U/L 59  AST 0 - 37 U/L 28  ALT 0 - 35 U/L 34   Edinburgh Score: Edinburgh Postnatal Depression Scale Screening Tool 08/28/2019  I have been able to laugh and see the funny side of things. 0  I have looked forward with enjoyment to things. 0  I have blamed myself unnecessarily when things went wrong. 0  I have been anxious or worried for no good reason. 0  I have  felt scared or panicky for no good reason. 0  Things have been getting on top of me. 0  I have been so unhappy that I have had difficulty sleeping. 0  I have felt sad or miserable. 0  I have been so unhappy that I have been crying. 0  The thought of harming myself has occurred to me. 0  Edinburgh Postnatal Depression Scale Total 0     After visit meds:  Allergies as of 08/30/2019   No Known Allergies     Medication List    TAKE these medications   acetaminophen 325 MG tablet Commonly known as: Tylenol Take 2 tablets (650 mg total) by mouth every 6 (six) hours as needed (for pain scale < 4).   ibuprofen 600 MG tablet Commonly known as: ADVIL Take 1 tablet (600 mg total) by mouth every 8 (eight) hours as needed for mild pain.   PRENATAL  VITAMINS PO Take 1 tablet by mouth daily.   senna-docusate 8.6-50 MG tablet Commonly known as: Senokot-S Take 2 tablets by mouth daily.        Discharge home in stable condition Infant Feeding: Breast Infant Disposition:home with mother Discharge instruction: per After Visit Summary and Postpartum booklet. Activity: Advance as tolerated. Pelvic rest for 6 weeks.  Diet: routine diet Future Appointments: Future Appointments  Date Time Provider [place]  10/01/2019  2:15 PM Burleson, Rona Ravens, NP [facility]   Follow up Visit:   Please schedule this patient for a In person postpartum visit in 4 weeks with the following provider: Any provider. Additional Postpartum F/U:None  Low risk pregnancy complicated by: None Delivery mode:  Vaginal, Spontaneous  Anticipated Birth Control:  Depo at [facility] visit   08/30/2019 Chauncey Mann, MD

## 2019-08-30 NOTE — Progress Notes (Signed)
AVS printed and discharged instructions given to parents. MOB informed to call for follow up appointment with video interpretor and to pick up prescriptions. Pt verbalized understanding and has no further questions.

## 2019-10-01 ENCOUNTER — Ambulatory Visit: Payer: Self-pay | Admitting: Nurse Practitioner

## 2021-03-15 NOTE — L&D Delivery Note (Cosign Needed Addendum)
Delivery Note Progressed quickly to complete dilation and pushed over 2 contractions to delivery  At 2:57 AM a viable and healthy female was delivered via Vaginal, Spontaneous (Presentation: Right Occiput Anterior).  APGAR: 9, 9; weight  .   Placenta status: Spontaneous, Intact.  Cord: 3 vessels with the following complications: light to moderate meconium stained fluid and nuchal cord  Anesthesia: None Episiotomy: None Lacerations: None Suture Repair:  none Est. Blood Loss (mL): 177  Mom to postpartum.  Baby to Couplet care / Skin to Skin.  Hansel Feinstein 01/13/2022, 3:35 AM

## 2021-06-20 IMAGING — US US MFM OB COMP +14 WKS
2 series · 13 of 28 positions shown · non-contrast
Comparison: none

[Series 1: us mfm ob comp +14 wks · 8 of 66 slices shown (1 of 2)]
[im 5/66]
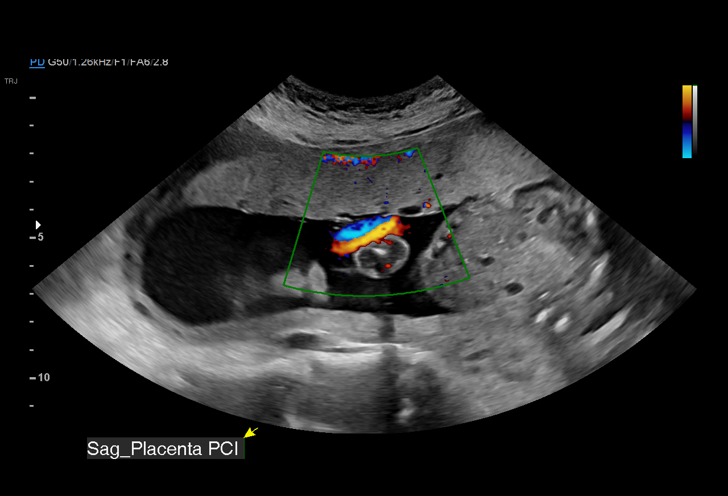
[im 13/66]
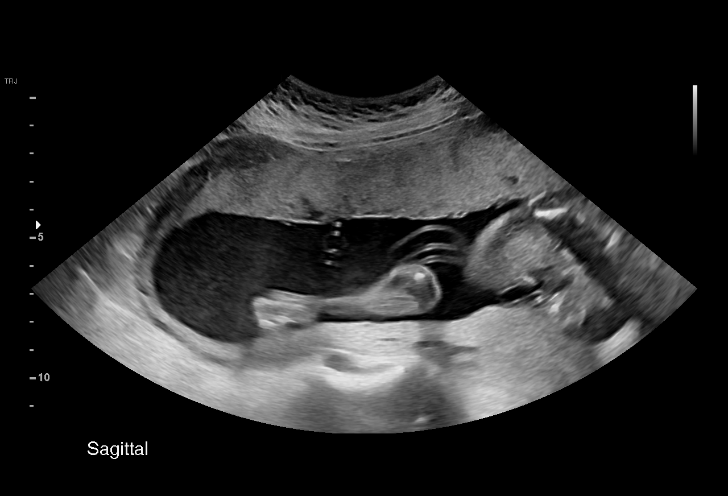
[im 21/66]
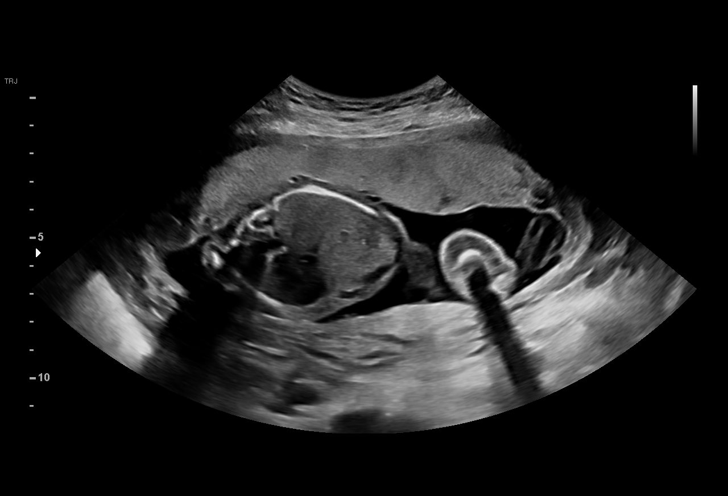
[im 29/66]
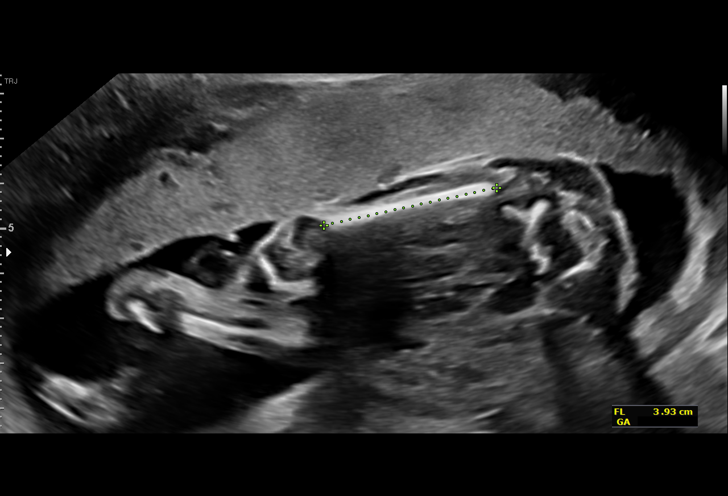
[im 37/66]
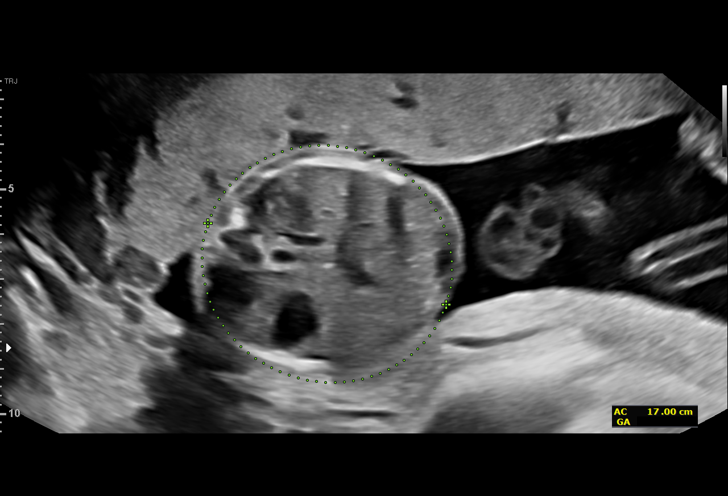
[im 45/66]
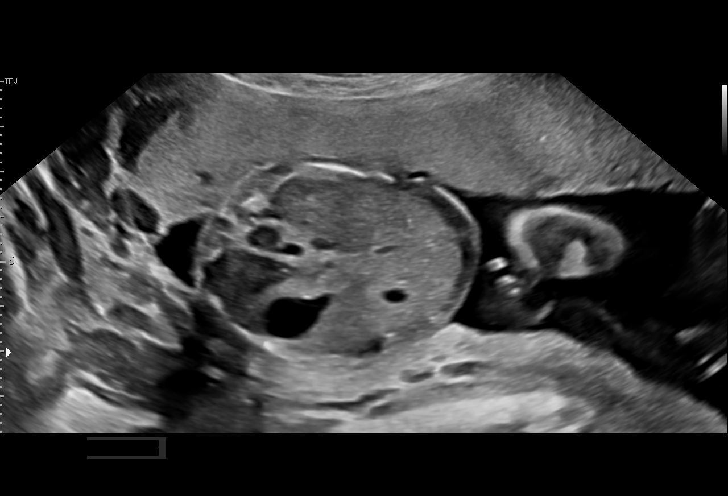
[im 57/66]
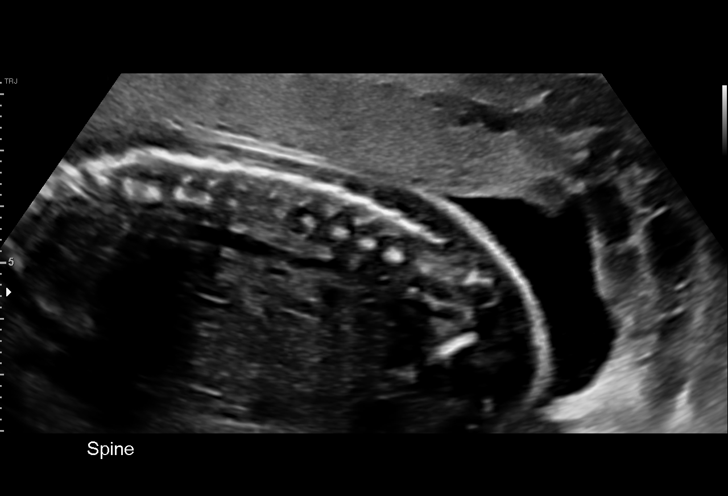
[im 66/66]
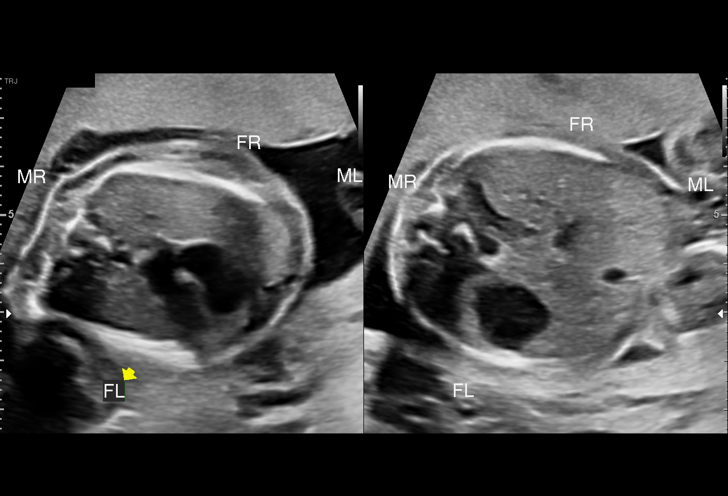

[Series 2: us mfm ob comp +14 wks · 40 acquisitions, 5 frames shown (2 of 2)]
[im 4/40]
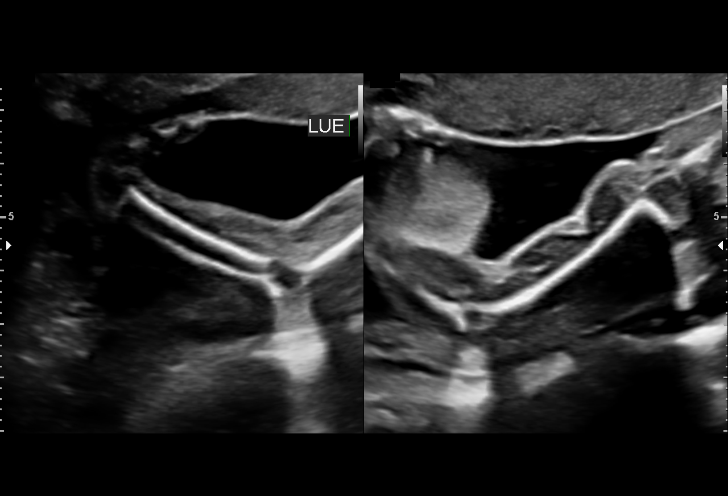
[im 12/40]
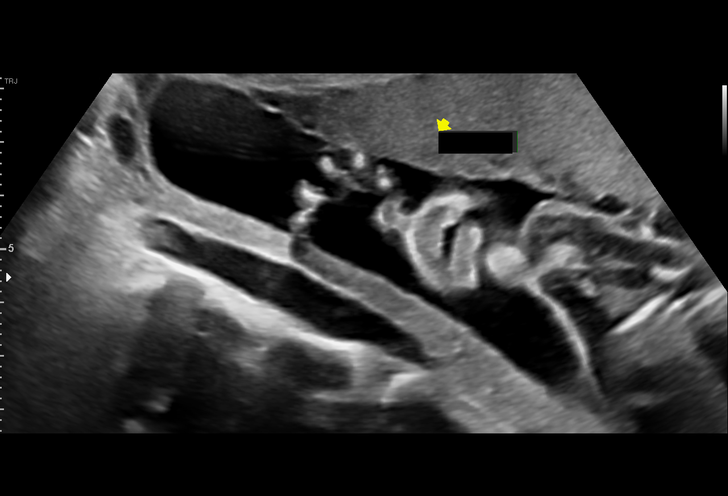
[im 20/40]
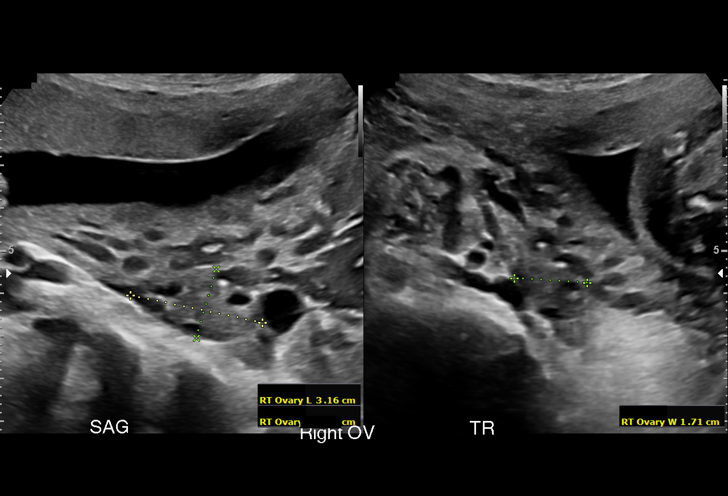
[im 28/40]
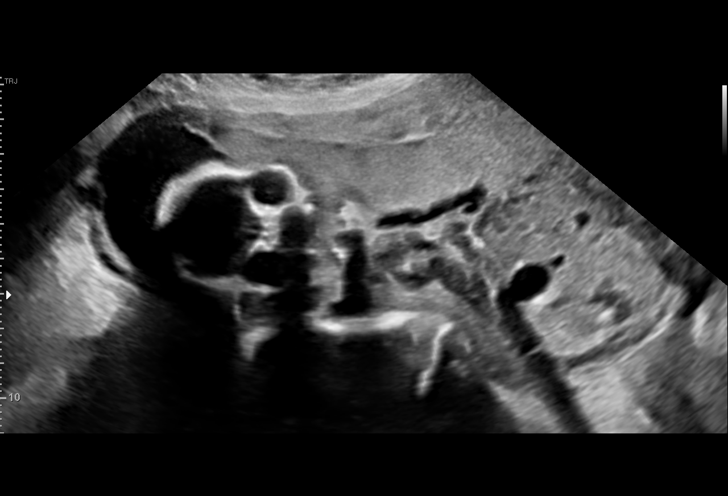
[im 36/40]
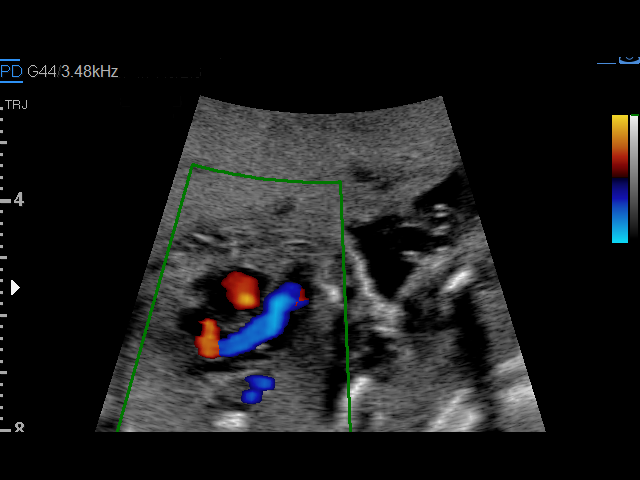

[13 of 28 positions shown; findings below may reference images not displayed]

MDEMRAN

 Performed By:     Enoc [facility]           Secondary Phy.:   [[email or web address]]
                   [address]

  1  US MFM OB COMP + 14 WK               [address]805.01     VERONA KILLIAN
 ----------------------------------------------------------------------

 ----------------------------------------------------------------------
Indications

  22 weeks gestation of pregnancy
  Encounter for uncertain dates
  Encounter for antenatal screening for
  malformations
 ----------------------------------------------------------------------
Fetal Evaluation

 Num Of Fetuses:         1
 Cardiac Activity:       Observed
 Presentation:           Breech
 Placenta:               Anterior
 P. Cord Insertion:      Visualized, central

 Amniotic Fluid
 AFI FV:      Subjectively within normal limits

                             Largest Pocket(cm)

Biometry

 BPD:      48.7  mm     G. Age:  20w 5d          8  %    CI:        59.95   %    70 - 86
                                                         FL/HC:      19.0   %    18.4 -
 HC:       204   mm     G. Age:  22w 4d         61  %    HC/AC:      1.18        1.06 -
 AC:      172.9  mm     G. Age:  22w 2d         50  %    FL/BPD:     79.5   %    71 - 87
 FL:       38.7  mm     G. Age:  22w 3d         54  %    FL/AC:      22.4   %    20 - 24
 HUM:      35.5  mm     G. Age:  22w 2d         56  %
 CER:      22.9  mm     G. Age:  21w 3d         34  %
 LV:        6.2  mm
 CM:        2.3  mm

 Est. FW:     489  gm      1 lb 1 oz     57  %
OB History

 Gravidity:    2         Term:   1
 Living:       1
Gestational Age

 U/S Today:     22w 0d                                        EDD:   08/29/19
 Best:          22w 0d     Det. By:  U/S (04/25/19)           EDD:   08/29/19
Anatomy

 Cranium:               Appears normal         LVOT:                   Appears normal
 Cavum:                 Appears normal         Aortic Arch:            Appears normal
 Ventricles:            Appears normal         Ductal Arch:            Appears normal
 Choroid Plexus:        Appears normal         Diaphragm:              Appears normal
 Cerebellum:            Appears normal         Stomach:                Appears normal, left
                                                                       sided
 Posterior Fossa:       Appears normal         Abdomen:                Appears normal
 Nuchal Fold:           Not applicable (>20    Abdominal Wall:         Appears nml (cord
                        wks GA)                                        insert, abd wall)
 Face:                  Appears normal         Cord Vessels:           Appears normal (3
                        (orbits and profile)                           vessel cord)
 Lips:                  Appears normal         Kidneys:                Appear normal
 Palate:                Appears normal         Bladder:                Appears normal
 Thoracic:              Appears normal         Spine:                  Appears normal
 Heart:                 Appears normal         Upper Extremities:      Appears normal
                        (4CH, axis, and
                        situs)
 RVOT:                  Appears normal         Lower Extremities:      Appears normal

 Other:  Nasal bone visualized. Heels and 5th digit visualized. Fetus appears
         to be a male.
Cervix Uterus Adnexa

 Cervix
 Length:            4.6  cm.
 Normal appearance by transabdominal scan.

 Left Ovary
 Within normal limits.

 Right Ovary
 Within normal limits.
Impression

 Ms. NYA JUMPER with uncertain gestational age is here for
 ultrasound evaluation. Obstetric history is significant for a
 term vaginal delivery of a male infant weighing 3,379 grams
 at birth. She reports no chronic medical conditions.

 Fetal biometry is consistent with 22-weeks' gestation.
 Amniotic fluid is normal and good fetal activity is seen. No
 markers of aneuploidies or fetal structural defects are seen.
 Fetal anatomy appears normal.

 I explained the findings with help of Spanish language
 interpreter.
 We have assigned her EDD at 08/29/2019.
Recommendations

 -An appointment was made for her to return in 4 weeks for
 fetal growth assessment.
                 Herschel Yamashita

## 2021-06-23 ENCOUNTER — Telehealth: Payer: Self-pay | Admitting: Lactation Services

## 2021-06-23 ENCOUNTER — Ambulatory Visit (INDEPENDENT_AMBULATORY_CARE_PROVIDER_SITE_OTHER): Payer: Self-pay

## 2021-06-23 VITALS — BP 109/68 | HR 84 | Wt 99.0 lb

## 2021-06-23 DIAGNOSIS — Z3201 Encounter for pregnancy test, result positive: Secondary | ICD-10-CM

## 2021-06-23 LAB — POCT PREGNANCY, URINE: Preg Test, Ur: POSITIVE — AB

## 2021-06-23 NOTE — Telephone Encounter (Signed)
Was asked to call mom by RN in the office about weaning a 30 yo from the breast as she is now pregnant again.  ? ?Called patient with [employer] Spanish Interpreter, Dominica Severin, # [id number].  ? ?Mom reports toddler is still nursing and she would like to stop breast feeding him.  ? ?Patient reports toddler is breast feeding 9 am, 2-3 and at night 8-9 pm. She reports her breasts are filling up and having lumps. She is pumping also. She pumps in the afternoon to give infant the milk in the bottle. She is pumping 1 a day and getting 4 ounces. She reports that her breasts do get really full and feels pretty empty after infant feeds.  ? ?She reports she generally produces a lot of milk. She breast fed both children for 2 years each. Reviewed not weaning suddenly as can cause plugged ducts and Mastitis.  ? ?Reviewed supply and demand and importance of decreasing stimulation to the breast. Reviewed decreasing the amount she is pumping by 1 ounce every 2-3 days until not needing to pump for that pumping.  ? ?Reviewed when nursing child can decrease the amount of time he is on the breast and do not empty the breast fully to slow the production more naturally.  ? ?Reviewed cold, crumpled cabbage leaves ad lib to breasts and change when they get warm.  ? ?Patient voiced understanding to all the above. Mom to call the office with any questions or concerns as needed.  ?

## 2021-06-23 NOTE — Progress Notes (Signed)
Patient here today for a pregnancy test. Urine pregnancy test positive. Today's visit conducted with the help of spanish interpreter Raquel. Patient states she took a pregnancy test at home on 06/16/21 and it was positive. Patient denies any pain or vaginal bleeding. Per patient her LMP was 04/18/21 making her [redacted]w[redacted]d today with an EDD of 01/23/22. Patient received prenatal care here for her last pregnancy and would like to continue receiving prenatal care here at the [facility] for this pregnancy. New OB appointment scheduled. I recommended patient begin taking prenatal vitamins. Patient denies any other questions or concerns.  ? ?Paulina Fusi, RN ?06/23/21 ?

## 2021-07-02 ENCOUNTER — Telehealth (INDEPENDENT_AMBULATORY_CARE_PROVIDER_SITE_OTHER): Payer: Self-pay

## 2021-07-02 DIAGNOSIS — R110 Nausea: Secondary | ICD-10-CM

## 2021-07-02 DIAGNOSIS — Z3A00 Weeks of gestation of pregnancy not specified: Secondary | ICD-10-CM

## 2021-07-02 DIAGNOSIS — Z3480 Encounter for supervision of other normal pregnancy, unspecified trimester: Secondary | ICD-10-CM | POA: Insufficient documentation

## 2021-07-02 MED ORDER — VITAMIN B-6 100 MG PO TABS: 100.0000 mg | ORAL_TABLET | Freq: Every day | ORAL | 2 refills | Status: DC

## 2021-07-02 MED ORDER — UNISOM SLEEPTABS 25 MG PO TABS: 25.0000 mg | ORAL_TABLET | Freq: Every evening | ORAL | 2 refills | Status: DC | PRN

## 2021-07-02 NOTE — Patient Instructions (Signed)

## 2021-07-02 NOTE — Progress Notes (Signed)
New OB Intake ? ?I connected with  Monica Bryan on 07/02/21 at  8:15 AM EDT by telephone  Visit and verified that I am speaking with the correct person using two identifiers. Nurse is located at [facility] and pt is located at [employer]. ? ?I discussed the limitations, risks, security and privacy concerns of performing an evaluation and management service by telephone and the availability of in person appointments. I also discussed with the patient that there may be a patient responsible charge related to this service. The patient expressed understanding and agreed to proceed. ? ?I explained I am completing New OB Intake today. We discussed her EDD of 01/23/22 that is based on LMP of 04/18/21. Pt is G3/P2. I reviewed her allergies, medications, Medical/Surgical/OB history, and appropriate screenings. I informed her of [facility] services. Based on history, this is a/an  pregnancy uncomplicated .  ? ?Patient Active Problem List  ? Diagnosis Date Noted  ? Labor and delivery, indication for care 08/27/2019  ? GBS (group B Streptococcus carrier), +RV culture, currently pregnant 08/22/2019  ? Positive GBS test 08/16/2019  ? Rubella non-immune status, antepartum 05/10/2019  ? Supervision of low-risk pregnancy 05/02/2019  ? History of Bell's palsy   ? Headache   ? Family history of diabetes mellitus (DM) 02/15/2014  ? Loss of weight 02/15/2014  ? Cephalalgia 02/15/2014  ? Esophageal reflux 02/15/2014  ? Insufficient prenatal care 12/22/2011  ? ? ?Concerns addressed today ? ?Delivery Plans:  ?Plans to deliver at [facility].  ? ?MyChart/Babyscripts ?MyChart access verified. I explained pt will have some visits in office and some virtually. Babyscripts instructions given and order placed. Patient verifies receipt of registration text/e-mail. Account successfully created and app downloaded. ? ?Blood Pressure Cuff  ?Patient is self-pay; explained patient will be given BP cuff at first prenatal appt. Explained after first  prenatal appt pt will check weekly and document in 18. ? ?Weight scale: Patient does / does not  have weight scale. Weight scale ordered for patient to pick up from [employer].  ? ?Anatomy US ?Explained first scheduled [place] will be around 19 weeks. Anatomy US scheduled for 08/31/21 at 0930. Pt notified to arrive at 0915. ?Scheduled AFP lab only appointment if CenteringPregnancy pt for same day as anatomy US.  ? ?Labs ?Discussed Johnsie Cancel genetic screening with patient. Would like both Panorama and Horizon drawn at new OB visit.Also if interested in genetic testing, tell patient she will need AFP 15-21 weeks to complete genetic testing .Routine prenatal labs needed. ? ?Covid Vaccine ?Patient has covid vaccine.  ? ?Is patient a CenteringPregnancy candidate?  No Spanish Sessions "Centering Patient" indicated on sticky note ?  ?Is patient a Mom+Baby Combined Care candidate? Not a candidate   Scheduled with Mom+Baby provider  ?  ?Is patient interested in Penelope? No  "Interested in [employer] - Schedule next visit with CNM" on sticky note ? ?Informed patient of [facility] website  and placed link in her AVS.  ? ?Social Determinants of Health ?Food Insecurity: Patient denies food insecurity. ?WIC Referral: Patient is interested in referral to [facility].  ?Transportation: Patient denies transportation needs. ?Childcare: Discussed no children allowed at ultrasound appointments. Offered childcare services; patient declines childcare services at this time. ? ?Send link to Pregnancy Navigators ? ? ?Placed OB Box on problem list and updated ? ?First visit review ?I reviewed new OB appt with pt. I explained she will have a pelvic exam, ob bloodwork with genetic screening, and PAP smear.  Explained pt will be seen by Dr. Gwenlyn Perking at first visit; encounter routed to appropriate provider. Explained that patient will be seen by pregnancy navigator following visit with provider. [facility] information placed in AVS.  ? ?Bethanne Ginger,  CMA ?07/02/2021  9:04 AM  ?

## 2021-07-02 NOTE — Addendum Note (Signed)
Addended by: Henrietta Dine on: 07/02/2021 02:26 PM ? ? Modules accepted: Orders ? ?

## 2021-07-02 NOTE — Progress Notes (Signed)
Pt advised that she currently breast feeds her 30 yr old & was told by family that it could hurt the unborn child, assured Pt that breast feeding is safe during Pregnancy as long as she currently does not have any complications. Pt verbalized understanding. ?

## 2021-07-20 ENCOUNTER — Encounter: Payer: Self-pay | Admitting: Family Medicine

## 2021-07-20 ENCOUNTER — Other Ambulatory Visit (HOSPITAL_COMMUNITY)
Admission: RE | Admit: 2021-07-20 | Discharge: 2021-07-20 | Disposition: A | Discharge status: Home / Self Care | Payer: Self-pay | Source: Ambulatory Visit | Attending: Family Medicine | Admitting: Family Medicine

## 2021-07-20 ENCOUNTER — Ambulatory Visit (INDEPENDENT_AMBULATORY_CARE_PROVIDER_SITE_OTHER): Payer: Self-pay | Admitting: Family Medicine

## 2021-07-20 VITALS — BP 117/79 | HR 102 | Wt 100.2 lb

## 2021-07-20 DIAGNOSIS — Z3480 Encounter for supervision of other normal pregnancy, unspecified trimester: Secondary | ICD-10-CM | POA: Insufficient documentation

## 2021-07-20 DIAGNOSIS — N368 Other specified disorders of urethra: Secondary | ICD-10-CM

## 2021-07-20 DIAGNOSIS — R110 Nausea: Secondary | ICD-10-CM

## 2021-07-20 DIAGNOSIS — N810 Urethrocele: Secondary | ICD-10-CM

## 2021-07-20 DIAGNOSIS — Z23 Encounter for immunization: Secondary | ICD-10-CM

## 2021-07-20 DIAGNOSIS — G43909 Migraine, unspecified, not intractable, without status migrainosus: Secondary | ICD-10-CM | POA: Insufficient documentation

## 2021-07-20 DIAGNOSIS — Z3A13 13 weeks gestation of pregnancy: Secondary | ICD-10-CM

## 2021-07-20 NOTE — Patient Instructions (Signed)
Puedes tomar Unisom and vitamin B6 para su nausea.  ?

## 2021-07-20 NOTE — Progress Notes (Signed)
?  ? ?Subjective:  ? ?Monica Bryan is a 31 y.o. G3P2002 at [redacted]w[redacted]d by LMP being seen today for her first obstetrical visit.  Her obstetrical history is significant for two prior uncomplicated pregnancies and vaginal deliveries.  She reports some mild nausea so far this pregnancy.  She does have a lump on her vaginal area that she would like checked today.  She states that she noticed it 3 days ago when wiping.  It does not hurt her or bother her in any way.  She denies any abnormal vaginal discharge, burning, irritation, pelvic pain, or bleeding.  Patient does intend to breast feed. Pregnancy history fully reviewed. ? ?HISTORY: ?OB History  ?Gravida Para Term Preterm AB Living  ?3 2 2  0 0 2  ?SAB IAB Ectopic Multiple Live Births  ?0 0 0 0 2  ?  ?# Outcome Date GA Lbr Len/2nd Weight Sex Delivery Anes PTL Lv  ?3 Current           ?2 Term 08/28/19 [redacted]w[redacted]d 12:15 / 00:20 8 lb 0.6 oz (3.646 kg) M Vag-Spont None  LIV  ?   Name: Archer As[facility]  ?   Apgar1: 8  Apgar5: 9  ?1 Term 01/16/12 [redacted]w[redacted]d / 02:06 7 lb 7.2 oz (3.379 kg)  Vag-Spont EPI  LIV  ?   Birth Comments: wnl  ?   Name: Layla Barter  ?   Apgar1: 8  Apgar5: 9  ? ?Last pap smear was  completed in 2021 and normal. Repeat due 2024.  ? ?Past Medical History:  ?Diagnosis Date  ? Chlamydia   ? Family history of diabetes mellitus (DM) 02/15/2014  ? GERD (gastroesophageal reflux disease)   ? Headache   ? History of Bell's palsy   ? No pertinent past medical history   ? ?Past Surgical History:  ?Procedure Laterality Date  ? NO PAST SURGERIES    ? ?Family History  ?Problem Relation Age of Onset  ? Diabetes Maternal Aunt   ? Heart disease Maternal Grandmother   ? Other Neg Hx   ? ?Social History  ? ?Tobacco Use  ? Smoking status: Never  ? Smokeless tobacco: Never  ?Vaping Use  ? Vaping Use: Never used  ?Substance Use Topics  ? Alcohol use: No  ?  Alcohol/week: 0.0 standard drinks  ? Drug use: No  ? ?No Known Allergies ? ?Current Outpatient  Medications on File Prior to Visit  ?Medication Sig Dispense Refill  ? acetaminophen (TYLENOL) 325 MG tablet Take 2 tablets (650 mg total) by mouth every 6 (six) hours as needed (for pain scale < 4). 30 tablet 0  ? Prenatal Vit-Fe Fumarate-FA (PRENATAL VITAMINS PO) Take 1 tablet by mouth daily.    ? doxylamine, Sleep, (UNISOM SLEEPTABS) 25 MG tablet Take 1 tablet (25 mg total) by mouth at bedtime as needed. (Patient not taking: Reported on 07/20/2021) 30 tablet 2  ? pyridOXINE (VITAMIN B-6) 100 MG tablet Take 1 tablet (100 mg total) by mouth daily. (Patient not taking: Reported on 07/20/2021) 30 tablet 2  ? ?No current facility-administered medications on file prior to visit.  ? ? ?Exam  ? ?Vitals:  ? 07/20/21 1514  ?BP: 117/79  ?Pulse: (!) 102  ?Weight: 100 lb 3.2 oz (45.5 kg)  ? ?Fetal Heart Rate (bpm): 162 ? ?Chaperone present for exam. ? ?Pelvic Exam: Perineum: No hemorrhoids, normal perineum  ? Vulva: Normal external genitalia, no lesions  ? Vagina:  Normal mucosa, normal discharge, small well  circumscribed soft 1 cm x 1 cm mass noted inferior to urethra, non-tender to palpation  ?System: General: Well-developed, well-nourished female in no acute distress  ? Skin: Warm and dry   ? Neurologic: Alert and oriented x 3, normal mood and affect  ? Extremities: Normal ROM, no LE edema   ? HEENT MMM, EOMI, conjunctivae clear   ? Cardiovascular: Normal rate, warm and well perfused   ? Respiratory:  Normal work of breathing on room air   ? Abdomen: Soft, non-tender   ? ?  ?Assessment:  ? ?Pregnancy: [id number] ?Patient Active Problem List  ? Diagnosis Date Noted  ? Migraine 07/20/2021  ? Supervision of other normal pregnancy, antepartum 07/02/2021  ? Rubella non-immune status, antepartum 05/10/2019  ? ?Plan:  ? ?1. Supervision of other normal pregnancy, antepartum ?2. [redacted] weeks gestation of pregnancy ?Progressing well. FHT within normal limits. Pregnancy history fully reviewed. Initial prenatal labs, genetic screening, and [place]  ordered as below. Flu vaccine given. Will follow up results. Next prenatal visit in 4 weeks.  ?- Genetic Screening ?- CBC/D/Plt+RPR+Rh+ABO+RubIgG... ?- Hemoglobin A1c ?- Culture, OB Urine ?- CHL AMB BABYSCRIPTS SCHEDULE OPTIMIZATION ?- Flu Vaccine QUAD 36+ mos IM (Fluarix, Quad PF) ?- Cervicovaginal ancillary only( [place]) ? ?3. Nausea ?Well managed at this time. Eating and drinking well. Will continue to monitor.  ? ?4. Mass of urethra ?Small mass noted inferior to urethra as documented above. Consulted with Dr. Emeterio Reeve in office today who evaluated patient at bedside. Overall felt to be benign in nature, likely urethrocele vs diverticulum vs cyst. Will refer to Urology for further evaluation.  ? ?[employer] Spanish video interpreter Orma Render [phone]) and in-person Spanish interpreter used for entirety of visit.  ? ?Initial labs drawn. ?Continue prenatal vitamins. ?Genetic Screening discussed: NIPS ordered. ?Ultrasound discussed; fetal anatomic survey: Scheduled for 08/31/2021. ?Problem list reviewed and updated. ?The nature of [facility] with multiple MDs and other Advanced Practice Providers was explained to patient; also emphasized that residents, students are part of our team. ?Routine obstetric precautions reviewed. ? ?Return in about 4 weeks (around 08/17/2021) for follow up OB visit. ? ?Vilma Meckel, MD ? ?

## 2021-07-21 LAB — CERVICOVAGINAL ANCILLARY ONLY
Bacterial Vaginitis (gardnerella): NEGATIVE
Candida Glabrata: NEGATIVE
Candida Vaginitis: NEGATIVE
Chlamydia: NEGATIVE
Comment: NEGATIVE
Comment: NEGATIVE
Comment: NEGATIVE
Comment: NEGATIVE
Comment: NEGATIVE
Comment: NORMAL
Neisseria Gonorrhea: NEGATIVE
Trichomonas: NEGATIVE

## 2021-07-22 ENCOUNTER — Encounter: Payer: Self-pay | Admitting: Family Medicine

## 2021-07-22 LAB — HEMOGLOBIN A1C
Est. average glucose Bld gHb Est-mCnc: 94 mg/dL
Hgb A1c MFr Bld: 4.9 % (ref 4.8–5.6)

## 2021-07-22 LAB — CBC/D/PLT+RPR+RH+ABO+RUBIGG...
Antibody Screen: NEGATIVE
Basophils Absolute: 0 10*3/uL (ref 0.0–0.2)
Basos: 0 %
EOS (ABSOLUTE): 0 10*3/uL (ref 0.0–0.4)
Eos: 1 %
HCV Ab: NONREACTIVE
HIV Screen 4th Generation wRfx: NONREACTIVE
Hematocrit: 35.6 % (ref 34.0–46.6)
Hemoglobin: 12.4 g/dL (ref 11.1–15.9)
Hepatitis B Surface Ag: NEGATIVE
Immature Grans (Abs): 0.1 10*3/uL (ref 0.0–0.1)
Immature Granulocytes: 1 %
Lymphocytes Absolute: 2 10*3/uL (ref 0.7–3.1)
Lymphs: 27 %
MCH: 32 pg (ref 26.6–33.0)
MCHC: 34.8 g/dL (ref 31.5–35.7)
MCV: 92 fL (ref 79–97)
Monocytes Absolute: 0.4 10*3/uL (ref 0.1–0.9)
Monocytes: 6 %
Neutrophils Absolute: 4.7 10*3/uL (ref 1.4–7.0)
Neutrophils: 65 %
Platelets: 232 10*3/uL (ref 150–450)
RBC: 3.88 x10E6/uL (ref 3.77–5.28)
RDW: 12.4 % (ref 11.7–15.4)
RPR Ser Ql: NONREACTIVE
Rh Factor: POSITIVE
Rubella Antibodies, IGG: <0.9 index — ABNORMAL LOW (ref >0.99)
WBC: 7.1 10*3/uL (ref 3.4–10.8)

## 2021-07-22 LAB — CULTURE, OB URINE

## 2021-07-22 LAB — URINE CULTURE, OB REFLEX: Organism ID, Bacteria: NO GROWTH

## 2021-07-22 LAB — HCV INTERPRETATION

## 2021-07-29 ENCOUNTER — Encounter: Payer: Self-pay | Admitting: *Deleted

## 2021-07-30 ENCOUNTER — Telehealth: Payer: Self-pay

## 2021-07-30 NOTE — Telephone Encounter (Addendum)
-----   Message from Worthy Rancher, MD sent at 07/29/2021  8:33 PM EDT ----- Please let patient know her [facility] genetic screening was normal. She is having a baby girl!   -CMA  Called pt with interpreter Eda; results given.

## 2021-08-17 ENCOUNTER — Ambulatory Visit (INDEPENDENT_AMBULATORY_CARE_PROVIDER_SITE_OTHER): Payer: Self-pay | Admitting: Obstetrics & Gynecology

## 2021-08-17 VITALS — BP 106/64 | HR 82 | Wt 102.8 lb

## 2021-08-17 DIAGNOSIS — Z3402 Encounter for supervision of normal first pregnancy, second trimester: Secondary | ICD-10-CM

## 2021-08-17 DIAGNOSIS — Z2839 Other underimmunization status: Secondary | ICD-10-CM

## 2021-08-17 DIAGNOSIS — Z3480 Encounter for supervision of other normal pregnancy, unspecified trimester: Secondary | ICD-10-CM

## 2021-08-17 DIAGNOSIS — O09899 Supervision of other high risk pregnancies, unspecified trimester: Secondary | ICD-10-CM

## 2021-08-17 NOTE — Progress Notes (Signed)
   PRENATAL VISIT NOTE  Subjective:  Monica Bryan is a 31 y.o. G3P2002 at [redacted]w[redacted]d being seen today for ongoing prenatal care.  She is currently monitored for the following issues for this low-risk pregnancy and has Rubella non-immune status, antepartum; Supervision of other normal pregnancy, antepartum; and Migraine on their problem list.  Patient reports no complaints. Endorses good fetal movement. Contractions: Not present. Vag. Bleeding: None. Movement: Present. Denies leaking of fluid. Her nausea has resolved, no concerns at this time.   The following portions of the patient's history were reviewed and updated as appropriate: allergies, current medications, past family history, past medical history, past social history, past surgical history and problem list.   Objective:   Vitals:   08/17/21 1649  BP: 106/64  Pulse: 82  Weight: 102 lb 12.8 oz (46.6 kg)    Fetal Status: Fetal Heart Rate (bpm): 154   Movement: Present    General:  Alert, oriented and cooperative. Patient is in no acute distress.  Skin: Skin is warm and dry. No rash noted.   Cardiovascular: Normal heart rate noted  Respiratory: Normal respiratory effort, no problems with respiration noted  Abdomen: Soft, gravid, appropriate for gestational age.  Pain/Pressure: Absent     Pelvic: Cervical exam deferred        Extremities: Normal range of motion.  Edema: None  Mental Status: Normal mood and affect. Normal behavior. Normal judgment and thought content.   Assessment and Plan:  Pregnancy: G3P2002 at [redacted]w[redacted]d  1. Encounter for supervision of low-risk first pregnancy in second trimester -AFP at next appointment  2. Rubella non-immune status, antepartum  3. Supervision of other normal pregnancy, antepartum   Preterm labor symptoms and general obstetric precautions including but not limited to vaginal bleeding, contractions, leaking of fluid and fetal movement were reviewed in detail with the  patient. Please refer to After Visit Summary for other counseling recommendations.   Return in about 4 weeks (around 09/14/2021).  Future Appointments  Date Time Provider [place]  08/31/2021  9:30 AM [facility] US2 [facility]  09/16/2021 11:15 AM Aletha Halim, MD [facility]  12/11/2021  2:20 PM Jaquita Folds, MD [facility] [place], Student-PA 08/17/21 5:02 PM

## 2021-08-17 NOTE — Progress Notes (Signed)
Patient ID: Monica Bryan, female   DOB: 24-Aug-1990, 31 y.o.   MRN: [id number] Attestation of Attending Supervision of PAStudent: Evaluation and management procedures were performed by the PA student under my supervision and collaboration.  I have reviewed the student's note and chart, and I agree with the management and plan.  Scheryl Darter, MD, FACOG Attending Obstetrician & Gynecologist Faculty Practice, [facility]

## 2021-08-31 ENCOUNTER — Ambulatory Visit: Payer: Self-pay | Attending: Family Medicine

## 2021-08-31 DIAGNOSIS — Z3480 Encounter for supervision of other normal pregnancy, unspecified trimester: Secondary | ICD-10-CM | POA: Insufficient documentation

## 2021-08-31 DIAGNOSIS — Z363 Encounter for antenatal screening for malformations: Secondary | ICD-10-CM | POA: Insufficient documentation

## 2021-08-31 DIAGNOSIS — O358XX0 Maternal care for other (suspected) fetal abnormality and damage, not applicable or unspecified: Secondary | ICD-10-CM | POA: Insufficient documentation

## 2021-08-31 DIAGNOSIS — Z3A19 19 weeks gestation of pregnancy: Secondary | ICD-10-CM | POA: Insufficient documentation

## 2021-09-16 ENCOUNTER — Ambulatory Visit (INDEPENDENT_AMBULATORY_CARE_PROVIDER_SITE_OTHER): Payer: Self-pay | Admitting: Obstetrics and Gynecology

## 2021-09-16 ENCOUNTER — Other Ambulatory Visit: Payer: Self-pay

## 2021-09-16 VITALS — BP 106/64 | HR 97 | Wt 108.0 lb

## 2021-09-16 DIAGNOSIS — Z3480 Encounter for supervision of other normal pregnancy, unspecified trimester: Secondary | ICD-10-CM

## 2021-09-16 DIAGNOSIS — Z789 Other specified health status: Secondary | ICD-10-CM

## 2021-09-16 DIAGNOSIS — Z3402 Encounter for supervision of normal first pregnancy, second trimester: Secondary | ICD-10-CM

## 2021-09-16 NOTE — Progress Notes (Signed)
   PRENATAL VISIT NOTE  Subjective:  Monica Bryan is a 31 y.o. G3P2002 at [redacted]w[redacted]d being seen today for ongoing prenatal care.  She is currently monitored for the following issues for this low-risk pregnancy and has Rubella non-immune status, antepartum; Supervision of other normal pregnancy, antepartum; and Migraine on their problem list.  Patient reports no complaints.  Contractions: Not present. Vag. Bleeding: None.  Movement: Present. Denies leaking of fluid.   The following portions of the patient's history were reviewed and updated as appropriate: allergies, current medications, past family history, past medical history, past social history, past surgical history and problem list.   Objective:   Vitals:   09/16/21 1122  BP: 106/64  Pulse: 97  Weight: 108 lb (49 kg)    Fetal Status: Fetal Heart Rate (bpm): 160   Movement: Present     General:  Alert, oriented and cooperative. Patient is in no acute distress.  Skin: Skin is warm and dry. No rash noted.   Cardiovascular: Normal heart rate noted  Respiratory: Normal respiratory effort, no problems with respiration noted  Abdomen: Soft, gravid, appropriate for gestational age.  Pain/Pressure: Absent     Pelvic: Cervical exam deferred        Extremities: Normal range of motion.  Edema: None  Mental Status: Normal mood and affect. Normal behavior. Normal judgment and thought content.   Assessment and Plan:  Pregnancy: G3P2002 at [redacted]w[redacted]d 1. Encounter for supervision of low-risk first pregnancy in second trimester Routine care. Mfm anatomy u/s neg. Pt would like afp  Interpreter used  Preterm labor symptoms and general obstetric precautions including but not limited to vaginal bleeding, contractions, leaking of fluid and fetal movement were reviewed in detail with the patient. Please refer to After Visit Summary for other counseling recommendations.   No follow-ups on file.  Future Appointments  Date Time Provider  Department Center  12/11/2021  2:20 PM Marguerita Beards, MD [facility]    [place] Bing, MD

## 2021-09-18 LAB — AFP, SERUM, OPEN SPINA BIFIDA
AFP MoM: 0.55
AFP Value: 48.4 ng/mL
Gest. Age on Collection Date: 21.4 weeks
Maternal Age At EDD: 30.4 yr
OSBR Risk 1 IN: 10000
Test Results:: NEGATIVE
Weight: 108 [lb_av]

## 2021-10-15 ENCOUNTER — Ambulatory Visit (INDEPENDENT_AMBULATORY_CARE_PROVIDER_SITE_OTHER): Payer: Self-pay | Admitting: Obstetrics & Gynecology

## 2021-10-15 ENCOUNTER — Other Ambulatory Visit: Payer: Self-pay

## 2021-10-15 ENCOUNTER — Encounter: Payer: Self-pay | Admitting: Obstetrics & Gynecology

## 2021-10-15 VITALS — BP 108/77 | HR 120 | Wt 112.0 lb

## 2021-10-15 DIAGNOSIS — Z3A25 25 weeks gestation of pregnancy: Secondary | ICD-10-CM

## 2021-10-15 DIAGNOSIS — Z3480 Encounter for supervision of other normal pregnancy, unspecified trimester: Secondary | ICD-10-CM

## 2021-10-15 NOTE — Progress Notes (Signed)
   PRENATAL VISIT NOTE  Subjective:  Monica Bryan is a 31 y.o. G3P2002 at [redacted]w[redacted]d being seen today for ongoing prenatal care.  Patient is Spanish-speaking only, interpreter present for this encounter. She is currently monitored for the following issues for this low-risk pregnancy and has Rubella non-immune status, antepartum; Supervision of other normal pregnancy, antepartum; Migraine; and Language barrier on their problem list.  Patient reports no complaints.  Contractions: Not present. Vag. Bleeding: None.  Movement: Present. Denies leaking of fluid.   The following portions of the patient's history were reviewed and updated as appropriate: allergies, current medications, past family history, past medical history, past social history, past surgical history and problem list.   Objective:   Vitals:   10/15/21 1108  BP: 108/77  Pulse: (!) 120  Weight: 112 lb (50.8 kg)    Fetal Status: Fetal Heart Rate (bpm): 172   Movement: Present     General:  Alert, oriented and cooperative. Patient is in no acute distress.  Skin: Skin is warm and dry. No rash noted.   Cardiovascular: Normal heart rate noted  Respiratory: Normal respiratory effort, no problems with respiration noted  Abdomen: Soft, gravid, appropriate for gestational age.  Pain/Pressure: Absent     Pelvic: Cervical exam deferred        Extremities: Normal range of motion.  Edema: None  Mental Status: Normal mood and affect. Normal behavior. Normal judgment and thought content.   Assessment and Plan:  Pregnancy: G3P2002 at [redacted]w[redacted]d 1. [redacted] weeks gestation of pregnancy 2. Supervision of other normal pregnancy, antepartum No concerns. Preterm labor symptoms and general obstetric precautions including but not limited to vaginal bleeding, contractions, leaking of fluid and fetal movement were reviewed in detail with the patient. Please refer to After Visit Summary for other counseling recommendations.   Return in about 3  weeks (around 11/05/2021) for 2 hr GTT, 3rd trimester labs, TDap, OFFICE OB VISIT (MD or APP).  Future Appointments  Date Time Provider Department Center  12/11/2021  2:20 PM Marguerita Beards, MD [facility]    Jaynie Collins, MD

## 2021-11-04 ENCOUNTER — Other Ambulatory Visit: Payer: Self-pay

## 2021-11-04 DIAGNOSIS — Z3480 Encounter for supervision of other normal pregnancy, unspecified trimester: Secondary | ICD-10-CM

## 2021-11-05 ENCOUNTER — Ambulatory Visit (INDEPENDENT_AMBULATORY_CARE_PROVIDER_SITE_OTHER): Payer: Self-pay | Admitting: Family Medicine

## 2021-11-05 ENCOUNTER — Other Ambulatory Visit: Payer: Self-pay

## 2021-11-05 VITALS — BP 108/68 | HR 98 | Wt 115.4 lb

## 2021-11-05 DIAGNOSIS — Z23 Encounter for immunization: Secondary | ICD-10-CM

## 2021-11-05 DIAGNOSIS — Z789 Other specified health status: Secondary | ICD-10-CM

## 2021-11-05 DIAGNOSIS — Z2839 Other underimmunization status: Secondary | ICD-10-CM

## 2021-11-05 DIAGNOSIS — Z3480 Encounter for supervision of other normal pregnancy, unspecified trimester: Secondary | ICD-10-CM

## 2021-11-05 DIAGNOSIS — O09899 Supervision of other high risk pregnancies, unspecified trimester: Secondary | ICD-10-CM

## 2021-11-05 NOTE — Progress Notes (Signed)
   PRENATAL VISIT NOTE  Subjective:  Stacye Noori is a 31 y.o. G3P2002 at 79w5dbeing seen today for ongoing prenatal care.  She is currently monitored for the following issues for this low-risk pregnancy and has Rubella non-immune status, antepartum; Supervision of other normal pregnancy, antepartum; Migraine; and Language barrier on their problem list.  Patient reports no complaints.  Contractions: Irritability. Vag. Bleeding: None.  Movement: Present. Denies leaking of fluid.   The following portions of the patient's history were reviewed and updated as appropriate: allergies, current medications, past family history, past medical history, past social history, past surgical history and problem list.   Objective:   Vitals:   11/05/21 0940  BP: 108/68  Pulse: 98  Weight: 115 lb 6.4 oz (52.3 kg)    Fetal Status: Fetal Heart Rate (bpm): 145 Fundal Height: 27 cm Movement: Present     General:  Alert, oriented and cooperative. Patient is in no acute distress.  Skin: Skin is warm and dry. No rash noted.   Cardiovascular: Normal heart rate noted  Respiratory: Normal respiratory effort, no problems with respiration noted  Abdomen: Soft, gravid, appropriate for gestational age.  Pain/Pressure: Present     Pelvic: Cervical exam deferred        Extremities: Normal range of motion.  Edema: None  Mental Status: Normal mood and affect. Normal behavior. Normal judgment and thought content.   Assessment and Plan:  Pregnancy: G3P2002 at 257w5d. Supervision of other normal pregnancy, antepartum 28 wk labs and TDaP today  2. Need for Tdap vaccination - Tdap vaccine greater than or equal to 7yo IM  3. Language barrier Spanish interpreter: MaVerdis Fredericksonsed  4. Rubella non-immune status, antepartum MMR pp  Preterm labor symptoms and general obstetric precautions including but not limited to vaginal bleeding, contractions, leaking of fluid and fetal movement were reviewed in detail  with the patient. Please refer to After Visit Summary for other counseling recommendations.   [facility] 2 [facility]on [facility]19/2021[facility] Future Appointments  Date Time Provider Department Center  11/05/2021 10:55 AM PrDonnamae JudeMD [facility]9/09/2021  9:15 AM ErChancy MilroyMD [facility]/29/2023  2:20 PM ScJaquita FoldsMD [facility]  TaDonnamae JudeMD

## 2021-11-06 LAB — GLUCOSE TOLERANCE, 2 HOURS W/ 1HR
Glucose, 1 hour: 111 mg/dL (ref 70–179)
Glucose, 2 hour: 78 mg/dL (ref 70–152)
Glucose, Fasting: 89 mg/dL (ref 70–91)

## 2021-11-06 LAB — RPR: RPR Ser Ql: NONREACTIVE

## 2021-11-06 LAB — HIV ANTIBODY (ROUTINE TESTING W REFLEX): HIV Screen 4th Generation wRfx: NONREACTIVE

## 2021-11-06 LAB — CBC
Hematocrit: 33.3 % — ABNORMAL LOW (ref 34.0–46.6)
Hemoglobin: 11.4 g/dL (ref 11.1–15.9)
MCH: 32.1 pg (ref 26.6–33.0)
MCHC: 34.2 g/dL (ref 31.5–35.7)
MCV: 94 fL (ref 79–97)
Platelets: 214 10*3/uL (ref 150–450)
RBC: 3.55 x10E6/uL — ABNORMAL LOW (ref 3.77–5.28)
RDW: 11.8 % (ref 11.7–15.4)
WBC: 7.3 10*3/uL (ref 3.4–10.8)

## 2021-11-19 ENCOUNTER — Other Ambulatory Visit: Payer: Self-pay

## 2021-11-19 ENCOUNTER — Ambulatory Visit (INDEPENDENT_AMBULATORY_CARE_PROVIDER_SITE_OTHER): Payer: Self-pay | Admitting: Obstetrics and Gynecology

## 2021-11-19 ENCOUNTER — Encounter: Payer: Self-pay | Admitting: Obstetrics and Gynecology

## 2021-11-19 VITALS — BP 113/66 | HR 112 | Wt 118.7 lb

## 2021-11-19 DIAGNOSIS — Z3480 Encounter for supervision of other normal pregnancy, unspecified trimester: Secondary | ICD-10-CM

## 2021-11-19 DIAGNOSIS — Z789 Other specified health status: Secondary | ICD-10-CM

## 2021-11-19 DIAGNOSIS — Z2839 Other underimmunization status: Secondary | ICD-10-CM

## 2021-11-19 DIAGNOSIS — O09899 Supervision of other high risk pregnancies, unspecified trimester: Secondary | ICD-10-CM

## 2021-11-19 NOTE — Progress Notes (Signed)
Subjective:  Monica Bryan is a 31 y.o. G3P2002 at [redacted]w[redacted]d being seen today for ongoing prenatal care.  She is currently monitored for the following issues for this low-risk pregnancy and has Rubella non-immune status, antepartum; Supervision of other normal pregnancy, antepartum; and Language barrier on their problem list.  Patient reports general discomforts of pregnancy.  Contractions: Irritability. Vag. Bleeding: None.  Movement: Present. Denies leaking of fluid.   The following portions of the patient's history were reviewed and updated as appropriate: allergies, current medications, past family history, past medical history, past social history, past surgical history and problem list. Problem list updated.  Objective:   Vitals:   11/19/21 0911  BP: 113/66  Pulse: (!) 112  Weight: 118 lb 11.2 oz (53.8 kg)    Fetal Status: Fetal Heart Rate (bpm): 144   Movement: Present     General:  Alert, oriented and cooperative. Patient is in no acute distress.  Skin: Skin is warm and dry. No rash noted.   Cardiovascular: Normal heart rate noted  Respiratory: Normal respiratory effort, no problems with respiration noted  Abdomen: Soft, gravid, appropriate for gestational age. Pain/Pressure: Present     Pelvic:  Cervical exam deferred        Extremities: Normal range of motion.  Edema: None  Mental Status: Normal mood and affect. Normal behavior. Normal judgment and thought content.   Urinalysis:      Assessment and Plan:  Pregnancy: G3P2002 at [redacted]w[redacted]d  1. Supervision of other normal pregnancy, antepartum Stable  2. Rubella non-immune status, antepartum Vaccine PP  3. Language barrier Video interrupter used during today's visit  Preterm labor symptoms and general obstetric precautions including but not limited to vaginal bleeding, contractions, leaking of fluid and fetal movement were reviewed in detail with the patient. Please refer to After Visit Summary for other  counseling recommendations.  Return in about 2 weeks (around 12/03/2021) for OB visit, face to face, any provider.   Hermina Staggers, MD

## 2021-11-19 NOTE — Progress Notes (Signed)
Pt reports a weird sensation in abdomen at night, she has not experienced it in her past pregnancy.

## 2021-11-19 NOTE — Patient Instructions (Signed)
Tercer trimestre de embarazo Third Trimester of Pregnancy  El tercer trimestre de embarazo va desde la semana 28 hasta la semana 40. Tambin se dice que va desde el mes 7 hasta el mes 9. En este trimestre, el beb en gestacin (feto) crece muy rpidamente. Hacia el final del noveno mes, el beb en gestacin mide alrededor de 20 pulgadas (45 cm) de largo. Pesa entre 6 y 10 libras (2,70 y 4,50 kg). Cambios en el cuerpo durante el tercer trimestre Su organismo contina atravesando por muchos cambios durante este perodo. Los cambios varan y generalmente vuelven a la normalidad despus del nacimiento del beb. Cambios fsicos Seguir aumentando de peso. Puede ser que aumente entre 25 y 35 libras (11 y 16 kg) hacia el final del embarazo. Si tiene bajo peso, puede aumentar entre 28 y 40 lb (unos 13 a 18 kg). Si tiene sobrepeso, puede aumentar entre 15 y 25 libras (unos 7 a 11 kg). Podrn aparecer las primeras estras en las caderas, el vientre (abdomen) y las mamas. Las mamas seguirn creciendo y pueden doler. Un lquido amarillo (calostro) puede salir de sus pechos. Esta es la primera leche que usted produce para el beb. Tal vez haya cambios en el cabello. El ombligo puede salir hacia afuera. Puede observar que se le hinchan ms las manos, la cara o los tobillos. Cambios en la salud Es posible que tenga acidez estomacal. Es posible que tenga dificultades para defecar (estreimiento). Pueden aparecerle hemorroides. Estas son venas hinchadas en el ano que pueden picar o doler. Puede comenzar a tener venas hinchadas (vrices) en las piernas. Puede presentar ms dolor en la pelvis, la espalda o los muslos. Puede presentar ms hormigueo o entumecimiento en las manos, los brazos y las piernas. La piel de su vientre tambin puede sentirse entumecida. Es posible que sienta falta de aire a medida que el tero se agranda. Otros cambios Es posible que haga pis (orine) con mayor frecuencia. Puede tener ms  problemas para dormir. Puede notar que el beb en gestacin "baja" o se mueve ms hacia bajo, en el vientre. Puede notar ms secrecin proveniente de la vagina. Puede sentir las articulaciones flojas y puede sentir dolor alrededor del hueso plvico. Siga estas instrucciones en su casa: Medicamentos Use los medicamentos de venta libre y los recetados solamente como se lo haya indicado el mdico. Algunos medicamentos no son seguros durante el embarazo. Tome vitaminas prenatales que contengan por lo menos 600 microgramos (mcg) de cido flico. Comida y bebida Consuma comidas saludables que incluyan lo siguiente: Frutas y verduras frescas. Cereales integrales. Buenas fuentes de protenas, como carne, huevos y tofu. Productos lcteos con bajo contenido de grasa. Evite la carne cruda y el jugo, la leche y el queso sin pasteurizar. Estos portan grmenes que pueden provocar dao tanto a usted como al beb. Tome 4 o 5 comidas pequeas en lugar de 3 comidas abundantes al da. Es posible que deba tomar medidas para prevenir o tratar los problemas para defecar: Beber suficiente lquido para mantener el pis (orina) de color amarillo plido. Come alimentos ricos en fibra. Entre ellos, frijoles, cereales integrales y frutas y verduras frescas. Limitar los alimentos con alto contenido de grasa y azcar. Estos incluyen alimentos fritos o dulces. Actividad Haga ejercicios solamente como se lo haya indicado el mdico. Interrumpa la actividad fsica si comienza a tener clicos en el tero. Evite levantar pesos excesivos. No haga ejercicio si hace demasiado calor, hay demasiada humedad o se encuentra en un lugar de mucha   altura (altitud elevada). Si lo desea, puede continuar teniendo relaciones sexuales, a menos que el mdico le indique lo contrario. Alivio del dolor y del malestar Haga pausas con frecuencia y descanse con las piernas levantadas (elevadas) si tiene calambres en las piernas o dolor en la parte  baja de la espalda. Dese baos de asiento con agua tibia para aliviar el dolor o las molestias causadas por las hemorroides. Use una crema para las hemorroides si el mdico la autoriza. Use un sostn que le brinde buen soporte si sus mamas estn sensibles. Si desarrolla venas hinchadas y abultadas en las piernas: Use medias de compresin segn las indicaciones de su mdico. Levante los pies durante 15 minutos, 3 o 4 veces por da. Limite la sal en sus alimentos. Seguridad Hable con el mdico antes de recorrer largas distancias. No se d baos de inmersin en agua caliente, baos turcos ni saunas. Use el cinturn de seguridad en todo momento mientras vaya en auto. Hable con el mdico si alguien le est haciendo dao o gritando mucho. Preparacin para la llegada del beb Para prepararse para la llegada de su beb: Tome clases prenatales. Visite el hospital y recorra el rea de maternidad. Compre un asiento de seguridad orientado hacia atrs para llevar al beb en el automvil. Aprenda cmo instalarlo en el auto. Prepare la habitacin del beb. Saque todas las almohadas y los animales de peluche de la cuna del beb. Instrucciones generales Evite el contacto con las bandejas sanitarias de los gatos y la tierra que estos animales usan. Estos contienen grmenes que pueden daar al beb y causar la prdida del beb ya sea aborto espontneo o muerte fetal. No se haga duchas vaginales ni use tampones. No use tampones ni toallas higinicas perfumadas. No fume ni consuma ningn producto que contenga nicotina o tabaco. Si necesita ayuda para dejar de fumar, consulte al mdico. No beba alcohol. No use medicamentos a base de hierbas, drogas ilegales, ni medicamentos que el mdico no haya autorizado. Las sustancias qumicas de estos productos pueden afectar al beb. Cumpla con todas las visitas de seguimiento. Esto es importante. Dnde buscar ms informacin American Pregnancy Association (Asociacin  Americana del Embarazo): americanpregnancy.org American College of Obstetricians and Gynecologists (Colegio Estadounidense de Obstetras y Gineclogos): www.acog.org Office on Women's Health (Oficina para la Salud de la Mujer): womenshealth.gov/pregnancy Comunquese con un mdico si: Tiene fiebre. Tiene clicos leves o siente presin en la parte baja del vientre. Sufre un dolor persistente en el abdomen. Vomita o hace deposiciones acuosas (diarrea). Advierte lquido con mal olor que proviene de la vagina. Siente dolor al orinar o hace orina con mal olor. Tiene un dolor de cabeza que no desaparece despus de tomar analgsicos. Nota cambios en la visin o ve manchas delante de los ojos. Solicite ayuda de inmediato si: Rompe la bolsa. Tiene contracciones regulares separadas por menos de 5 minutos. Tiene sangrado o pequeas prdidas vaginales. Tiene clicos o dolor muy intensos en el vientre. Tiene dificultad para respirar. Sientes dolor en el pecho. Se desmaya. No ha sentido al beb moverse durante el tiempo que le indic el mdico. Tiene dolor, hinchazn o enrojecimiento nuevos en un brazo o una pierna o se produce un aumento de alguno de estos sntomas. Resumen El tercer trimestre comprende desde la semana 28 hasta la semana 40 (desde el mes 7 hasta el mes 9). Esta es la poca en que el beb en gestacin crece muy rpidamente. Durante este perodo, las molestias pueden aumentar a medida que usted   sube de peso y el beb crece. Preprese para la llegada del beb: asista a las clases prenatales, compre un asiento de seguridad orientado hacia atrs para llevar al beb en auto y prepare la habitacin del beb. Solicite ayuda de inmediato si tiene sangrado por la vagina, siente dolor en el pecho y tiene dificultad para respirar, o si no ha sentido al beb moverse durante el tiempo que le indic el mdico. Esta informacin no tiene como fin reemplazar el consejo del mdico. Asegrese de hacerle al  mdico cualquier pregunta que tenga. Document Revised: 09/12/2019 Document Reviewed: 09/12/2019 Elsevier Patient Education  2023 Elsevier Inc.  

## 2021-12-02 NOTE — Progress Notes (Unsigned)
   PRENATAL VISIT NOTE  Subjective:  Monica Bryan is a 31 y.o. G3P2002 at [redacted]w[redacted]d being seen today for ongoing prenatal care.  She is currently monitored for the following issues for this {Blank single:19197::"high-risk","low-risk"} pregnancy and has Rubella non-immune status, antepartum; Supervision of other normal pregnancy, antepartum; and Language barrier on their problem list.  Patient reports {sx:14538}.   .  .   . Denies leaking of fluid.   The following portions of the patient's history were reviewed and updated as appropriate: allergies, current medications, past family history, past medical history, past social history, past surgical history and problem list.   Objective:  There were no vitals filed for this visit.  Fetal Status:           General:  Alert, oriented and cooperative. Patient is in no acute distress.  Skin: Skin is warm and dry. No rash noted.   Cardiovascular: Normal heart rate noted  Respiratory: Normal respiratory effort, no problems with respiration noted  Abdomen: Soft, gravid, appropriate for gestational age.        Pelvic: {Blank single:19197::"Cervical exam performed in the presence of a chaperone","Cervical exam deferred"}        Extremities: Normal range of motion.     Mental Status: Normal mood and affect. Normal behavior. Normal judgment and thought content.   Assessment and Plan:  Pregnancy: G3P2002 at [redacted]w[redacted]d There are no diagnoses linked to this encounter. {Blank single:19197::"Term","Preterm"} labor symptoms and general obstetric precautions including but not limited to vaginal bleeding, contractions, leaking of fluid and fetal movement were reviewed in detail with the patient. Please refer to After Visit Summary for other counseling recommendations.   No follow-ups on file.  Future Appointments  Date Time Provider [place]  12/03/2021  3:15 PM Brown Human [facility]  12/11/2021  2:20 PM Jaquita Folds, MD [facility]  12/18/2021  8:35 AM Danielle Rankin [facility]  12/31/2021  9:15 AM Clement Husbands, Ernestine Conrad, DO [facility]    Starr [place], CNM

## 2021-12-03 ENCOUNTER — Other Ambulatory Visit: Payer: Self-pay

## 2021-12-03 ENCOUNTER — Ambulatory Visit (INDEPENDENT_AMBULATORY_CARE_PROVIDER_SITE_OTHER): Payer: Self-pay | Admitting: Student

## 2021-12-03 VITALS — BP 111/72 | HR 94 | Wt 117.0 lb

## 2021-12-03 DIAGNOSIS — Z3483 Encounter for supervision of other normal pregnancy, third trimester: Secondary | ICD-10-CM

## 2021-12-03 DIAGNOSIS — Z3480 Encounter for supervision of other normal pregnancy, unspecified trimester: Secondary | ICD-10-CM

## 2021-12-03 DIAGNOSIS — Z3A32 32 weeks gestation of pregnancy: Secondary | ICD-10-CM

## 2021-12-11 ENCOUNTER — Ambulatory Visit (INDEPENDENT_AMBULATORY_CARE_PROVIDER_SITE_OTHER): Payer: Self-pay | Admitting: Obstetrics and Gynecology

## 2021-12-11 ENCOUNTER — Encounter: Payer: Self-pay | Admitting: Obstetrics and Gynecology

## 2021-12-11 VITALS — BP 113/73 | HR 106 | Ht 60.5 in | Wt 116.0 lb

## 2021-12-11 DIAGNOSIS — N368 Other specified disorders of urethra: Secondary | ICD-10-CM

## 2021-12-11 DIAGNOSIS — R350 Frequency of micturition: Secondary | ICD-10-CM

## 2021-12-11 LAB — POCT URINALYSIS DIPSTICK
Bilirubin, UA: NEGATIVE
Blood, UA: NEGATIVE
Glucose, UA: NEGATIVE
Ketones, UA: NEGATIVE
Leukocytes, UA: NEGATIVE
Nitrite, UA: NEGATIVE
Protein, UA: NEGATIVE
Spec Grav, UA: >=1.03 — AB (ref 1.010–1.025)
Urobilinogen, UA: 0.2 E.U./dL
pH, UA: 5.5 (ref 5.0–8.0)

## 2021-12-11 NOTE — Progress Notes (Signed)
[facility] New Patient Evaluation and Consultation  Referring Provider: Genia Del, MD PCP: Patient, No Pcp Per Date of Service: 12/11/2021  SUBJECTIVE Chief Complaint: New Patient (Initial Visit)  History of Present Illness: Monica Bryan is a 31 y.o.  hispanic  female seen in consultation at the request of Dr. Gwenlyn Perking for evaluation of suburethral mass.    Review of records significant for: small well circumscribed soft 1 cm x 1 cm mass noted inferior to urethra, non-tender to palpation  Currently pregnant- 33w 6d  Urinary Symptoms: Does not leak urine.   Day time voids 3.  Nocturia: 4-5 times per night to void. Voiding dysfunction: she empties her bladder well.  does not use a catheter to empty bladder.  When urinating, she feels she has no difficulties  UTIs:  0  UTI's in the last year.   Denies history of blood in urine and kidney or bladder stones  Pelvic Organ Prolapse Symptoms:                  She Admits to a feeling of a bulge the vaginal area. It has been present for 1 month.  She Denies seeing a bulge.  This bulge is not bothersome.  Bowel Symptom: Bowel movements: 1 time(s) per day She thinks she may have hemorrhoids- noticed when she was straining yesterday.  Stool consistency: soft  Straining: sometimes Splinting: no.  Incomplete evacuation: no.  She Denies accidental bowel leakage / fecal incontinence  Sexual Function Sexually active: yes.  Pain with sex: No  Pelvic Pain Denies pelvic pain  Denies vaginal bleeding.   Past Medical History:  Past Medical History:  Diagnosis Date   Chlamydia    Family history of diabetes mellitus (DM) 02/15/2014   GERD (gastroesophageal reflux disease)    Headache    History of Bell's palsy    No pertinent past medical history      Past Surgical History:   Past Surgical History:  Procedure Laterality Date   NO PAST SURGERIES       Past OB/GYN History: OB History   Gravida Para Term Preterm AB Living  3 2 2     2   SAB IAB Ectopic Multiple Live Births        0 2    # Outcome Date GA Lbr Len/2nd Weight Sex Delivery Anes PTL Lv  3 Current           2 Term 08/28/19 [redacted]w[redacted]d 12:15 / 00:20 8 lb 0.6 oz (3.646 kg) M Vag-Spont None  LIV  1 Term 01/16/12 [redacted]w[redacted]d / 02:06 7 lb 7.2 oz (3.379 kg)  Vag-Spont EPI  LIV     Birth Comments: wnl   Last pap smear was 04/2022.     Medications: She has a current medication list which includes the following prescription(s): acetaminophen and prenatal vit-fe fumarate-fa.   Allergies: Patient has No Known Allergies.   Social History:  Social History   Tobacco Use   Smoking status: Never   Smokeless tobacco: Never  Vaping Use   Vaping Use: Never used  Substance Use Topics   Alcohol use: No    Alcohol/week: 0.0 standard drinks of alcohol   Drug use: No    Relationship status: long-term partner She lives with partner.   She is not employed. Regular exercise: No History of abuse: No  Family History:   Family History  Problem Relation Age of Onset   Diabetes Maternal Aunt    Heart disease  Maternal Grandmother    Other Neg Hx      Review of Systems: Review of Systems  Constitutional:  Negative for fever, malaise/fatigue and weight loss.  Respiratory:  Negative for cough, shortness of breath and wheezing.   Cardiovascular:  Negative for chest pain, palpitations and leg swelling.  Gastrointestinal:  Positive for blood in stool. Negative for abdominal pain.  Genitourinary:  Negative for dysuria.  Musculoskeletal:  Negative for myalgias.  Skin:  Negative for rash.  Neurological:  Negative for dizziness and headaches.  Endo/Heme/Allergies:  Does not bruise/bleed easily.  Psychiatric/Behavioral:  Negative for depression. The patient is not nervous/anxious.      OBJECTIVE Physical Exam: Vitals:   12/11/21 1430  BP: 113/73  Pulse: (!) 106  Weight: 116 lb (52.6 kg)  Height: 5' 0.5" (1.537 m)     Physical Exam Constitutional:      General: She is not in acute distress. Pulmonary:     Effort: Pulmonary effort is normal.  Abdominal:     General: There is no distension.     Palpations: Abdomen is soft.     Tenderness: There is no abdominal tenderness. There is no rebound.  Musculoskeletal:        General: No swelling. Normal range of motion.  Skin:    General: Skin is warm and dry.     Findings: No rash.  Neurological:     Mental Status: She is alert and oriented to person, place, and time.  Psychiatric:        Mood and Affect: Mood normal.        Behavior: Behavior normal.      GU / Detailed Urogynecologic Evaluation:  Pelvic Exam: Normal external female genitalia; Bartholin's and Skene's glands normal in appearance; urethral meatus normal in appearance, 2cm cystic appearing suburethral mass, mobile, nontender, not compressible.   CST: negative  Speculum exam reveals normal vaginal mucosa without atrophy. Cervix normal appearance. Uterus enlarged. Adnexa not palpable  Pelvic floor strength I/V  Pelvic floor musculature: Right levator non-tender, Right obturator non-tender, Left levator non-tender, Left obturator non-tender  POP-Q:   POP-Q  -3                                            Aa   -3                                           Ba  -6                                              C   4                                            Gh  3.5                                            Pb  9  tvl   -2                                            Ap  -2                                            Bp  -8.5                                              D     Rectal Exam:  Hemorrhoids present externally  Post-Void Residual (PVR) by Bladder Scan: Not performed due to pregnancy.   Laboratory Results: POC urine: negative  ASSESSMENT AND PLAN Monica Bryan is a 31 y.o. with:  1. Suburethral cyst   2.  Urinary frequency    - Suburethral mass appears cystic but need MRI pelvis to rule out urethral diverticulum. Since she is [redacted] weeks pregnant and not having any acute symptoms from the mass, can wait for MRI until after she has delivered.  - We briefly discussed that we could surgically remove the cyst, but the complexity of the surgery depends on the results of the MRI. There may be a need for a prolonged catheter if urethra is entered.   Return after MRI performed  Marguerita Beards, MD

## 2021-12-12 NOTE — Progress Notes (Signed)
Agree with nurses's documentation of this patient's clinic encounter.  Sarra Rachels L, MD  

## 2021-12-16 NOTE — Progress Notes (Signed)
Monica Bryan is a 31 y.o. 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 See scanned documentation. Expires 02/10/2022 CPT code: 72197 Dx code: N36.8  NO PA needed for this procedure.

## 2021-12-18 ENCOUNTER — Encounter: Payer: Self-pay | Admitting: Medical

## 2021-12-18 ENCOUNTER — Other Ambulatory Visit: Payer: Self-pay

## 2021-12-18 ENCOUNTER — Ambulatory Visit (INDEPENDENT_AMBULATORY_CARE_PROVIDER_SITE_OTHER): Payer: Self-pay | Admitting: Medical

## 2021-12-18 VITALS — BP 101/62 | HR 103 | Wt 121.0 lb

## 2021-12-18 DIAGNOSIS — N949 Unspecified condition associated with female genital organs and menstrual cycle: Secondary | ICD-10-CM

## 2021-12-18 DIAGNOSIS — Z603 Acculturation difficulty: Secondary | ICD-10-CM

## 2021-12-18 DIAGNOSIS — Z3480 Encounter for supervision of other normal pregnancy, unspecified trimester: Secondary | ICD-10-CM

## 2021-12-18 DIAGNOSIS — Z2839 Other underimmunization status: Secondary | ICD-10-CM

## 2021-12-18 DIAGNOSIS — Z789 Other specified health status: Secondary | ICD-10-CM

## 2021-12-18 DIAGNOSIS — Z3A34 34 weeks gestation of pregnancy: Secondary | ICD-10-CM

## 2021-12-18 DIAGNOSIS — O09899 Supervision of other high risk pregnancies, unspecified trimester: Secondary | ICD-10-CM

## 2021-12-18 NOTE — Progress Notes (Signed)
   PRENATAL VISIT NOTE  Subjective:  Monica Bryan is a 31 y.o. G3P2002 at [redacted]w[redacted]d being seen today for ongoing prenatal care.  She is currently monitored for the following issues for this low-risk pregnancy and has Rubella non-immune status, antepartum; Supervision of other normal pregnancy, antepartum; and Language barrier on their problem list.  Patient reports round ligament pain and pelvic pressure.  Contractions: Irritability. Vag. Bleeding: None.  Movement: Present. Denies leaking of fluid.   The following portions of the patient's history were reviewed and updated as appropriate: allergies, current medications, past family history, past medical history, past social history, past surgical history and problem list.   Objective:   Vitals:   12/18/21 0831  BP: 101/62  Pulse: (!) 103  Weight: 121 lb (54.9 kg)    Fetal Status: Fetal Heart Rate (bpm): 158 Fundal Height: 32 cm Movement: Present     General:  Alert, oriented and cooperative. Patient is in no acute distress.  Skin: Skin is warm and dry. No rash noted.   Cardiovascular: Normal heart rate noted  Respiratory: Normal respiratory effort, no problems with respiration noted  Abdomen: Soft, gravid, appropriate for gestational age.  Pain/Pressure: Present     Pelvic: Cervical exam deferred        Extremities: Normal range of motion.  Edema: None  Mental Status: Normal mood and affect. Normal behavior. Normal judgment and thought content.   Assessment and Plan:  Pregnancy: G3P2002 at [redacted]w[redacted]d 1. Supervision of other normal pregnancy, antepartum - Anticipatory guidance for GBS, GC/CT next visit discussed   2. Rubella non-immune status, antepartum - PP MMR   3. Language barrier - Interpreter present   4. Round ligament pain - Belly band given   5. [redacted] weeks gestation of pregnancy  Preterm labor symptoms and general obstetric precautions including but not limited to vaginal bleeding, contractions, leaking of  fluid and fetal movement were reviewed in detail with the patient. Please refer to After Visit Summary for other counseling recommendations.   Return in about 2 weeks (around 01/01/2022) for LOB, In-Person, any provider.  Future Appointments  Date Time Provider [place]  12/29/2021  2:00 PM [facility] 1 [facility]  12/31/2021  9:15 AM Shelda Pal, DO [facility]  02/09/2022 12:40 PM Jaquita Folds, MD [facility]    Kerry Hough, PA-C

## 2021-12-29 ENCOUNTER — Ambulatory Visit (HOSPITAL_COMMUNITY): Payer: Self-pay

## 2021-12-31 ENCOUNTER — Ambulatory Visit (INDEPENDENT_AMBULATORY_CARE_PROVIDER_SITE_OTHER): Payer: Self-pay | Admitting: Family Medicine

## 2021-12-31 ENCOUNTER — Other Ambulatory Visit: Payer: Self-pay

## 2021-12-31 ENCOUNTER — Other Ambulatory Visit (HOSPITAL_COMMUNITY)
Admission: RE | Admit: 2021-12-31 | Discharge: 2021-12-31 | Disposition: A | Discharge status: Home / Self Care | Payer: Self-pay | Source: Ambulatory Visit | Attending: Family Medicine | Admitting: Family Medicine

## 2021-12-31 VITALS — BP 113/74 | HR 114 | Wt 123.6 lb

## 2021-12-31 DIAGNOSIS — Z789 Other specified health status: Secondary | ICD-10-CM

## 2021-12-31 DIAGNOSIS — Z3480 Encounter for supervision of other normal pregnancy, unspecified trimester: Secondary | ICD-10-CM

## 2021-12-31 NOTE — Progress Notes (Signed)
PRENATAL VISIT NOTE  Subjective:  Monica Bryan is a 31 y.o. G3P2002 at [redacted]w[redacted]d being seen today for ongoing prenatal care.  She is currently monitored for the following issues for this low-risk pregnancy and has Rubella non-immune status, antepartum; Supervision of other normal pregnancy, antepartum; and Language barrier on their problem list.  Patient reports no complaints.  Contractions: Irritability. Vag. Bleeding: None.  Movement: Present. Denies leaking of fluid.   The following portions of the patient's history were reviewed and updated as appropriate: allergies, current medications, past family history, past medical history, past social history, past surgical history and problem list.   Objective:   Vitals:   12/31/21 0915  BP: 113/74  Pulse: (!) 114  Weight: 123 lb 9.6 oz (56.1 kg)    Fetal Status: Fetal Heart Rate (bpm): 154   Movement: Present     General:  Alert, oriented and cooperative. Patient is in no acute distress.  Skin: Skin is warm and dry. No rash noted.   Cardiovascular: Normal heart rate noted  Respiratory: Normal respiratory effort, no problems with respiration noted  Abdomen: Soft, gravid, appropriate for gestational age.  Pain/Pressure: Present     Pelvic: Cervical exam deferred        Extremities: Normal range of motion.  Edema: None  Mental Status: Normal mood and affect. Normal behavior. Normal judgment and thought content.   Assessment and Plan:  Pregnancy: G3P2002 at [redacted]w[redacted]d  1. Supervision of other normal pregnancy, antepartum - GC/Chlamydia probe amp ([place])not at [facility] - Culture, beta strep (group b only)  2. Language barrier  Spanish  Nursing Staff Provider  Office Location  [facility] Dating  01/23/22  [facility] Model [ X] Traditional [ ]  Centering [ ]  Mom-Baby Dyad    Language  Spanish Anatomy US  Normal  Flu Vaccine  07/20/21 Genetic/Carrier Screen  NIPS: LR female  AFP:   wnl Horizon: Negative  TDaP Vaccine   11/05/2021  Hgb A1C or  GTT Early 4.9 Third trimester 89/111/78  Landa- 2 doses   LAB RESULTS   Rhogam  N/A Blood Type O/Positive/-- (05/08 1611)   Baby Feeding Plan Breast Antibody Negative (05/08 1611)  Contraception Depo (not on abdomen) Rubella <0.90 (05/08 1611) NON-IMMUNE  Circumcision NA RPR Non Reactive (05/08 1611)   Pediatrician  Kids Care HBsAg Negative (05/08 1611)   Support Person Primo(FOB) HCVAb Non Reactive (05/08 1611)  Prenatal Classes NA HIV Non Reactive (05/08 1611)     BTL Consent NA GBS   (For PCN allergy, check sensitivities)   VBAC Consent NA Pap  2/21 normal       DME Rx Valu.Nieves ] BP cuff [ ]  Weight Scale Waterbirth  [ ]  Class [ ]  Consent [ ]  CNM visit  PHQ9 & GAD7 [x]  new OB [  ] 28 weeks  [  ] 36 weeks Induction  [ ]  Orders Entered [ ] Foley Y/N   Preterm labor symptoms and general obstetric precautions including but not limited to vaginal bleeding, contractions, leaking of fluid and fetal movement were reviewed in detail with the patient. Please refer to After Visit Summary for other counseling recommendations.   Return in about 1 week (around 01/07/2022) for ROB.  Future Appointments  Date Time Provider [place]  02/01/2022 10:00 AM [facility]  02/09/2022 12:40 PM Jaquita Folds, MD [facility] [place], [place] Fellow, Faculty practice [place] for [facility] 12/31/21  9:47 AM

## 2022-01-01 LAB — GC/CHLAMYDIA PROBE AMP (~~LOC~~) NOT AT ARMC
Chlamydia: NEGATIVE
Comment: NEGATIVE
Comment: NORMAL
Neisseria Gonorrhea: NEGATIVE

## 2022-01-04 LAB — CULTURE, BETA STREP (GROUP B ONLY): Strep Gp B Culture: NEGATIVE

## 2022-01-07 ENCOUNTER — Ambulatory Visit (INDEPENDENT_AMBULATORY_CARE_PROVIDER_SITE_OTHER): Payer: Self-pay

## 2022-01-07 ENCOUNTER — Other Ambulatory Visit: Payer: Self-pay

## 2022-01-07 VITALS — BP 106/71 | HR 89 | Wt 125.0 lb

## 2022-01-07 DIAGNOSIS — O09899 Supervision of other high risk pregnancies, unspecified trimester: Secondary | ICD-10-CM

## 2022-01-07 DIAGNOSIS — Z3483 Encounter for supervision of other normal pregnancy, third trimester: Secondary | ICD-10-CM

## 2022-01-07 DIAGNOSIS — Z3A37 37 weeks gestation of pregnancy: Secondary | ICD-10-CM

## 2022-01-07 DIAGNOSIS — Z3480 Encounter for supervision of other normal pregnancy, unspecified trimester: Secondary | ICD-10-CM

## 2022-01-07 DIAGNOSIS — O09893 Supervision of other high risk pregnancies, third trimester: Secondary | ICD-10-CM

## 2022-01-07 DIAGNOSIS — O26843 Uterine size-date discrepancy, third trimester: Secondary | ICD-10-CM

## 2022-01-07 DIAGNOSIS — Z603 Acculturation difficulty: Secondary | ICD-10-CM

## 2022-01-07 DIAGNOSIS — Z789 Other specified health status: Secondary | ICD-10-CM

## 2022-01-07 DIAGNOSIS — Z2839 Other underimmunization status: Secondary | ICD-10-CM

## 2022-01-07 NOTE — Progress Notes (Signed)
   PRENATAL VISIT NOTE  Subjective:  Elaura Calix is a 31 y.o. G3P2002 at [redacted]w[redacted]d being seen today for ongoing prenatal care.  She is currently monitored for the following issues for this low-risk pregnancy and has Rubella non-immune status, antepartum; Supervision of other normal pregnancy, antepartum; and Language barrier on their problem list.  Patient reports no complaints.  Contractions: Not present. Vag. Bleeding: None.  Movement: Present. Denies leaking of fluid.   The following portions of the patient's history were reviewed and updated as appropriate: allergies, current medications, past family history, past medical history, past social history, past surgical history and problem list.   Objective:   Vitals:   01/07/22 0914  BP: 106/71  Pulse: 89  Weight: 125 lb (56.7 kg)    Fetal Status: Fetal Heart Rate (bpm): 154 Fundal Height: 32 cm Movement: Present     General:  Alert, oriented and cooperative. Patient is in no acute distress.  Skin: Skin is warm and dry. No rash noted.   Cardiovascular: Normal heart rate noted  Respiratory: Normal respiratory effort, no problems with respiration noted  Abdomen: Soft, gravid, appropriate for gestational age.  Pain/Pressure: Present     Pelvic: Cervical exam not performed today.         Extremities: Normal range of motion.  Edema: None  Mental Status: Normal mood and affect. Normal behavior. Normal judgment and thought content.   Assessment and Plan:  Pregnancy: G3P2002 at [redacted]w[redacted]d 1. Supervision of other normal pregnancy, antepartum -Routine OB. Doing well, no concerns.   2. [redacted] weeks gestation of pregnancy -FH measuring at 32cm today. Has measured 32cm since 12/03/21, when she was [redacted]w[redacted]d gestation. Pt scheduled for Gregory [place] on 01/14/22 at 3:30pm at [facility].  -Endorses FM -Denies LOF, VB  3. Language barrier -Interpreter used during appt  4. Rubella non-immune status, antepartum -Plan to discuss MMR vaxx post-partum  Term  labor symptoms and general obstetric precautions including but not limited to vaginal bleeding, contractions, leaking of fluid and fetal movement were reviewed in detail with the patient. Please refer to After Visit Summary for other counseling recommendations.   Thomson [place] scheduled for 01/14/22. Return in about 1 week (around 01/14/2022) for LOB.  Future Appointments  Date Time Provider [place]  01/14/2022  3:45 PM [facility] US6 [facility]  02/01/2022 10:00 AM [facility]  02/09/2022 12:40 PM Jaquita Folds, MD [facility]    Lenore Manner A Assunta Pupo, Student-PA

## 2022-01-07 NOTE — Progress Notes (Deleted)
   PRENATAL VISIT NOTE  Subjective:  Opel Lejeune is a 31 y.o. G3P2002 at [redacted]w[redacted]d being seen today for ongoing prenatal care.  She is currently monitored for the following issues for this low-risk pregnancy and has Rubella non-immune status, antepartum; Supervision of other normal pregnancy, antepartum; and Language barrier on their problem list.  Patient reports no complaints.  Contractions: Not present. Vag. Bleeding: None.  Movement: Present. Denies leaking of fluid.   The following portions of the patient's history were reviewed and updated as appropriate: allergies, current medications, past family history, past medical history, past social history, past surgical history and problem list.   Objective:   Vitals:   01/07/22 0914  BP: 106/71  Pulse: 89  Weight: 125 lb (56.7 kg)    Fetal Status: Fetal Heart Rate (bpm): 154 Fundal Height: 32 cm Movement: Present     General:  Alert, oriented and cooperative. Patient is in no acute distress.  Skin: Skin is warm and dry. No rash noted.   Cardiovascular: Normal heart rate noted  Respiratory: Normal respiratory effort, no problems with respiration noted  Abdomen: Soft, gravid, appropriate for gestational age.  Pain/Pressure: Present     Pelvic: Cervical exam deferred        Extremities: Normal range of motion.  Edema: None  Mental Status: Normal mood and affect. Normal behavior. Normal judgment and thought content.   Assessment and Plan:  Pregnancy: G3P2002 at [redacted]w[redacted]d 1. Supervision of other normal pregnancy, antepartum - Routine OB. Doing well, no concerns - Anticipatory guidance for upcoming appointments provided  2. [redacted] weeks gestation of pregnancy - Size < dates - Endorses active fetal movement  3. Language barrier - Spanish interpretor used for entirety of today's visit  4. Rubella non-immune status, antepartum - Offer MMR pp  5. Uterine size date discrepancy pregnancy, third trimester - FH 32 cm today. Will  order growth ultrasound   - [place] MFM OB FOLLOW UP; Future  Term labor symptoms and general obstetric precautions including but not limited to vaginal bleeding, contractions, leaking of fluid and fetal movement were reviewed in detail with the patient. Please refer to After Visit Summary for other counseling recommendations.   Return in about 1 week (around 01/14/2022) for LOB.  Future Appointments  Date Time Provider [place]  01/14/2022  8:35 AM Donnamae Jude, MD [facility]  01/14/2022  3:45 PM [facility] US6 [facility]  02/01/2022 10:00 AM [facility]  02/09/2022 12:40 PM Jaquita Folds, MD [facility]    Renee Harder, CNM

## 2022-01-07 NOTE — Progress Notes (Signed)
   PRENATAL VISIT NOTE  Subjective:  Monica Bryan is a 31 y.o. G3P2002 at [redacted]w[redacted]d being seen today for ongoing prenatal care.  She is currently monitored for the following issues for this low-risk pregnancy and has Rubella non-immune status, antepartum; Supervision of other normal pregnancy, antepartum; and Language barrier on their problem list.  Patient reports no complaints.  Contractions: Not present. Vag. Bleeding: None.  Movement: Present. Denies leaking of fluid.   The following portions of the patient's history were reviewed and updated as appropriate: allergies, current medications, past family history, past medical history, past social history, past surgical history and problem list.   Objective:   Vitals:   01/07/22 0914  BP: 106/71  Pulse: 89  Weight: 125 lb (56.7 kg)    Fetal Status: Fetal Heart Rate (bpm): 154 Fundal Height: 32 cm Movement: Present     General:  Alert, oriented and cooperative. Patient is in no acute distress.  Skin: Skin is warm and dry. No rash noted.   Cardiovascular: Normal heart rate noted  Respiratory: Normal respiratory effort, no problems with respiration noted  Abdomen: Soft, gravid, appropriate for gestational age.  Pain/Pressure: Present     Pelvic: Cervical exam deferred        Extremities: Normal range of motion.  Edema: None  Mental Status: Normal mood and affect. Normal behavior. Normal judgment and thought content.   Assessment and Plan:  Pregnancy: G3P2002 at [redacted]w[redacted]d 1. Supervision of other normal pregnancy, antepartum - Routine OB. Doing well, no concerns - Anticipatory guidance for upcoming appointments provided  2. [redacted] weeks gestation of pregnancy - Size < dates - Endorses active fetal movement  3. Language barrier - Spanish interpretor used for entirety of today's visit  4. Rubella non-immune status, antepartum - Offer MMR pp  5. Uterine size date discrepancy pregnancy, third trimester - FH 32 cm today. Will  order growth ultrasound   - [place] MFM OB FOLLOW UP; Future  Term labor symptoms and general obstetric precautions including but not limited to vaginal bleeding, contractions, leaking of fluid and fetal movement were reviewed in detail with the patient. Please refer to After Visit Summary for other counseling recommendations.   Return in about 1 week (around 01/14/2022) for LOB.  Future Appointments  Date Time Provider [place]  01/14/2022  8:35 AM Donnamae Jude, MD [facility]  01/14/2022  3:45 PM [facility] US6 [facility]  02/01/2022 10:00 AM [facility] 1 [facility]  02/09/2022 12:40 PM Jaquita Folds, MD [facility]    Renee Harder, CNM

## 2022-01-12 ENCOUNTER — Encounter (HOSPITAL_COMMUNITY): Payer: Self-pay | Admitting: Obstetrics and Gynecology

## 2022-01-12 ENCOUNTER — Inpatient Hospital Stay (HOSPITAL_COMMUNITY)
Admission: AD | Admit: 2022-01-12 | Discharge: 2022-01-14 | DRG: 807 | Disposition: A | Discharge status: Home / Self Care | Payer: Medicaid Other | Attending: Obstetrics and Gynecology | Admitting: Obstetrics and Gynecology

## 2022-01-12 ENCOUNTER — Inpatient Hospital Stay (HOSPITAL_BASED_OUTPATIENT_CLINIC_OR_DEPARTMENT_OTHER): Payer: Medicaid Other

## 2022-01-12 ENCOUNTER — Inpatient Hospital Stay (HOSPITAL_COMMUNITY)
Admission: AD | Admit: 2022-01-12 | Discharge: 2022-01-12 | Disposition: A | Discharge status: Home / Self Care | Payer: Self-pay | Attending: Obstetrics and Gynecology | Admitting: Obstetrics and Gynecology

## 2022-01-12 DIAGNOSIS — Z3A38 38 weeks gestation of pregnancy: Secondary | ICD-10-CM

## 2022-01-12 DIAGNOSIS — O26843 Uterine size-date discrepancy, third trimester: Secondary | ICD-10-CM | POA: Insufficient documentation

## 2022-01-12 DIAGNOSIS — Z3480 Encounter for supervision of other normal pregnancy, unspecified trimester: Secondary | ICD-10-CM

## 2022-01-12 DIAGNOSIS — Z369 Encounter for antenatal screening, unspecified: Secondary | ICD-10-CM | POA: Insufficient documentation

## 2022-01-12 DIAGNOSIS — O471 False labor at or after 37 completed weeks of gestation: Secondary | ICD-10-CM

## 2022-01-12 NOTE — MAU Note (Signed)
.  Monica Bryan is a 31 y.o. at [redacted]w[redacted]d here in MAU reporting: was seen in MAU this morning and was 2.5 cm. Her ctx are now 2-3 minutes apart and 10/10. Denies LOF. Reports good FM and small amount of bloody show.  LMP: N/A Onset of complaint: Today Pain score: 10/10 Vitals:   01/12/22 2305  BP: 114/71  Pulse: (!) 105  Resp: 18  Temp: 98.1 F (36.7 C)  SpO2: 100%     FHT:158 Lab orders placed from triage:  MAU labor

## 2022-01-12 NOTE — MAU Provider Note (Signed)
Nurse Labor check      S: Ms. De Libman Elberta Leatherwood is a 31 y.o. G3P2002 at [redacted]w[redacted]d  who presents to MAU today complaining contractions q 5 minutes since 11p. She has occasional bloody show. She denies LOF. She reports normal fetal movement.    O: BP 118/80   Pulse 90   Temp 98 F (36.7 C) (Oral)   Resp 15   Ht 5\' 2"  (1.575 m)   Wt 56.7 kg   LMP 04/18/2021   SpO2 100%   BMI 22.86 kg/m    Cervical exam:  Dilation: 2.5 Effacement (%): 80 Cervical Position: Posterior Station: -3 Presentation: Vertex Exam by:: Cloretta Ned, RN   Fetal Monitoring: Baseline: 150 Variability: moderate Accelerations: present Decelerations: nonr Contractions: q5-7 minutes  US obtained for growth as S<D.   A: SIUP at [redacted]w[redacted]d  False labor  P: Cervix unchanged. EFW 13%, AC 45% Discharge to home  Truett Mainland, DO 01/12/2022 1:25 PM

## 2022-01-12 NOTE — MAU Note (Signed)
...  Monica Bryan is a 31 y.o. at [redacted]w[redacted]d here in MAU reporting: CTX since last night around 2300. Around 0600 this morning her pain increased and they became stronger. She reports they are now every 5 minutes. She endorses seeing red streaks of blood when she wipes after using the restroom. Denies LOF. +FM.  Prenatal on 10/26 patient's FH found to be 32 cm. [place] scheduled for 11/2.  Onset of complaint: Yesterday 2300 Pain score: 8/10 lower abdomen  FHT: 165 initial external Lab orders placed from triage:  MAU Labor

## 2022-01-13 ENCOUNTER — Encounter (HOSPITAL_COMMUNITY): Payer: Self-pay | Admitting: Obstetrics and Gynecology

## 2022-01-13 ENCOUNTER — Other Ambulatory Visit: Payer: Self-pay

## 2022-01-13 DIAGNOSIS — Z3A38 38 weeks gestation of pregnancy: Secondary | ICD-10-CM | POA: Diagnosis not present

## 2022-01-13 DIAGNOSIS — O26893 Other specified pregnancy related conditions, third trimester: Secondary | ICD-10-CM | POA: Diagnosis present

## 2022-01-13 LAB — CBC
HCT: 34 % — ABNORMAL LOW (ref 36.0–46.0)
Hemoglobin: 11.4 g/dL — ABNORMAL LOW (ref 12.0–15.0)
MCH: 30.2 pg (ref 26.0–34.0)
MCHC: 33.5 g/dL (ref 30.0–36.0)
MCV: 89.9 fL (ref 80.0–100.0)
Platelets: 257 10*3/uL (ref 150–400)
RBC: 3.78 MIL/uL — ABNORMAL LOW (ref 3.87–5.11)
RDW: 13.2 % (ref 11.5–15.5)
WBC: 10.1 10*3/uL (ref 4.0–10.5)
nRBC: 0 % (ref 0.0–0.2)

## 2022-01-13 LAB — RPR: RPR Ser Ql: NONREACTIVE

## 2022-01-13 LAB — TYPE AND SCREEN
ABO/RH(D): O POS
Antibody Screen: NEGATIVE

## 2022-01-13 MED ORDER — TETANUS-DIPHTH-ACELL PERTUSSIS 5-2.5-18.5 LF-MCG/0.5 IM SUSY: 0.5000 mL | PREFILLED_SYRINGE | Freq: Once | INTRAMUSCULAR | Status: DC

## 2022-01-13 MED ORDER — ACETAMINOPHEN 325 MG PO TABS: 650.0000 mg | ORAL_TABLET | ORAL | Status: DC | PRN

## 2022-01-13 MED ORDER — ONDANSETRON HCL 4 MG/2ML IJ SOLN: 4.0000 mg | Freq: Four times a day (QID) | INTRAMUSCULAR | Status: DC | PRN

## 2022-01-13 MED ORDER — FENTANYL CITRATE (PF) 100 MCG/2ML IJ SOLN: 100.0000 ug | INTRAMUSCULAR | Status: DC | PRN

## 2022-01-13 MED ORDER — FENTANYL CITRATE (PF) 100 MCG/2ML IJ SOLN
100.0000 ug | Freq: Once | INTRAMUSCULAR | Status: AC
  Administered 2022-01-13: 100 ug via INTRAVENOUS

## 2022-01-13 MED ORDER — DIPHENHYDRAMINE HCL 25 MG PO CAPS: 25.0000 mg | ORAL_CAPSULE | Freq: Four times a day (QID) | ORAL | Status: DC | PRN

## 2022-01-13 MED ORDER — PRENATAL MULTIVITAMIN CH
1.0000 | ORAL_TABLET | Freq: Every day | ORAL | Status: DC
  Administered 2022-01-13 – 2022-01-14 (×2): 1 via ORAL
  Filled 2022-01-13 (×2): qty 1

## 2022-01-13 MED ORDER — FENTANYL CITRATE (PF) 100 MCG/2ML IJ SOLN
INTRAMUSCULAR | Status: AC
  Filled 2022-01-13: qty 2

## 2022-01-13 MED ORDER — WITCH HAZEL-GLYCERIN EX PADS: 1.0000 | MEDICATED_PAD | CUTANEOUS | Status: DC | PRN

## 2022-01-13 MED ORDER — ONDANSETRON HCL 4 MG PO TABS: 4.0000 mg | ORAL_TABLET | ORAL | Status: DC | PRN

## 2022-01-13 MED ORDER — OXYCODONE-ACETAMINOPHEN 5-325 MG PO TABS: 1.0000 | ORAL_TABLET | ORAL | Status: DC | PRN

## 2022-01-13 MED ORDER — ACETAMINOPHEN 325 MG PO TABS
650.0000 mg | ORAL_TABLET | ORAL | Status: DC | PRN
  Administered 2022-01-13: 650 mg via ORAL
  Filled 2022-01-13: qty 2

## 2022-01-13 MED ORDER — LACTATED RINGERS IV SOLN
500.0000 mL | INTRAVENOUS | Status: DC | PRN
  Administered 2022-01-13: 1000 mL via INTRAVENOUS

## 2022-01-13 MED ORDER — LACTATED RINGERS IV SOLN: INTRAVENOUS | Status: DC

## 2022-01-13 MED ORDER — FENTANYL CITRATE (PF) 100 MCG/2ML IJ SOLN
INTRAMUSCULAR | Status: AC
  Administered 2022-01-13: 100 ug via INTRAVENOUS
  Filled 2022-01-13: qty 2

## 2022-01-13 MED ORDER — OXYTOCIN-SODIUM CHLORIDE 30-0.9 UT/500ML-% IV SOLN
2.5000 [IU]/h | INTRAVENOUS | Status: DC
  Filled 2022-01-13: qty 500

## 2022-01-13 MED ORDER — OXYTOCIN BOLUS FROM INFUSION
333.0000 mL | Freq: Once | INTRAVENOUS | Status: AC
  Administered 2022-01-13: 333 mL via INTRAVENOUS

## 2022-01-13 MED ORDER — OXYCODONE-ACETAMINOPHEN 5-325 MG PO TABS: 2.0000 | ORAL_TABLET | ORAL | Status: DC | PRN

## 2022-01-13 MED ORDER — FLEET ENEMA 7-19 GM/118ML RE ENEM: 1.0000 | ENEMA | RECTAL | Status: DC | PRN

## 2022-01-13 MED ORDER — LACTATED RINGERS IV BOLUS: 1000.0000 mL | Freq: Once | INTRAVENOUS | Status: DC

## 2022-01-13 MED ORDER — ONDANSETRON HCL 4 MG/2ML IJ SOLN: 4.0000 mg | INTRAMUSCULAR | Status: DC | PRN

## 2022-01-13 MED ORDER — SOD CITRATE-CITRIC ACID 500-334 MG/5ML PO SOLN: 30.0000 mL | ORAL | Status: DC | PRN

## 2022-01-13 MED ORDER — IBUPROFEN 600 MG PO TABS
600.0000 mg | ORAL_TABLET | Freq: Four times a day (QID) | ORAL | Status: DC
  Administered 2022-01-13 – 2022-01-14 (×6): 600 mg via ORAL
  Filled 2022-01-13 (×6): qty 1

## 2022-01-13 MED ORDER — BENZOCAINE-MENTHOL 20-0.5 % EX AERO: 1.0000 | INHALATION_SPRAY | CUTANEOUS | Status: DC | PRN

## 2022-01-13 MED ORDER — COCONUT OIL OIL: 1.0000 | TOPICAL_OIL | Status: DC | PRN

## 2022-01-13 MED ORDER — SENNOSIDES-DOCUSATE SODIUM 8.6-50 MG PO TABS
2.0000 | ORAL_TABLET | ORAL | Status: DC
  Administered 2022-01-13 – 2022-01-14 (×2): 2 via ORAL
  Filled 2022-01-13 (×3): qty 2

## 2022-01-13 MED ORDER — SIMETHICONE 80 MG PO CHEW: 80.0000 mg | CHEWABLE_TABLET | ORAL | Status: DC | PRN

## 2022-01-13 MED ORDER — DIBUCAINE (PERIANAL) 1 % EX OINT: 1.0000 | TOPICAL_OINTMENT | CUTANEOUS | Status: DC | PRN

## 2022-01-13 MED ORDER — ZOLPIDEM TARTRATE 5 MG PO TABS: 5.0000 mg | ORAL_TABLET | Freq: Every evening | ORAL | Status: DC | PRN

## 2022-01-13 MED ORDER — LIDOCAINE HCL (PF) 1 % IJ SOLN: 30.0000 mL | INTRAMUSCULAR | Status: DC | PRN

## 2022-01-13 NOTE — Progress Notes (Signed)
Ordered pt meals by Shayla Heming Spanish Medical Interpreter. 

## 2022-01-13 NOTE — Lactation Note (Signed)
This note was copied from a baby's chart. Lactation Consultation Note  Patient Name: Monica Bryan SMOLM'B Date: 01/13/2022 Reason for consult: Initial assessment;Early term 37-38.6wks Age:30 hours  P3: Early term infant at 38+4 weeks Feeding preference: Breast Weight loss: 0%  In house Spanish interpreter not currently available.  Used [employer] video interpreter, [place], [phone]) for interpretation.    "[name]" was swaddled and in mother's arms when I arrived.  Mother had no questions/concerns related to breast feeding and is still breast feeding her 31 year old.  She is familiar with hand expression.  Encouraged to continue lots of STS, breast massage and hand expression.  Reviewed breast feeding basics.  Offered to return for latch assistance as needed.  Mother stated that "Clayborn Heron" has been latching since birth; no pain or discomfort noted.  Father returned at the end of my visit.  He had no concerns at this time.  In house Spanish interpreter came by earlier today and ordered mother's meals for the entire day.  RN updated.    Maternal Data Has patient been taught Hand Expression?: Yes Does the patient have breastfeeding experience prior to this delivery?: Yes How long did the patient breastfeed?: 2 years with her first child and she is still breast feeding her second child  Feeding Mother's Current Feeding Choice: Breast Milk  LATCH Score                    Lactation Tools Discussed/Used    Interventions Interventions: Breast feeding basics reviewed;Education;LC Services brochure (Spanish brochure given)  Discharge Pump: Manual (Does not have or need an electric pump)  Consult Status Consult Status: Follow-up Date: 01/14/22 Follow-up type: In-patient    Pasqualino Witherspoon R Lelend Heinecke 01/13/2022, 8:20 AM

## 2022-01-13 NOTE — Discharge Summary (Signed)
Postpartum Discharge Summary  Date of Service updated***     Patient Name: Monica Bryan DOB: 01-17-1991 MRN: [id number]  Date of admission: 01/12/2022 Delivery date:01/13/2022  Delivering provider: Seabron Spates  Date of discharge: 01/13/2022  Admitting diagnosis: Indication for care in labor and delivery, antepartum [O75.9] Intrauterine pregnancy: [redacted]w[redacted]d    Secondary diagnosis:  Principal Problem:   Indication for care in labor and delivery, antepartum Active Problems:   Vaginal delivery  Additional problems: none    Discharge diagnosis: Term Pregnancy Delivered                                              Post partum procedures:{Postpartum procedures:23558} Augmentation: N/A Complications: None  Hospital course: Onset of Labor With Vaginal Delivery      31y.o. yo [id number] admitted in Active Labor on 01/12/2022. Labor course was complicated by meconium stained fluid  Membrane Rupture Time/Date: 2:57 AM ,01/13/2022   Delivery Method:Vaginal, Spontaneous  Episiotomy: None  Lacerations:  None  Patient had a postpartum course complicated by nothing.  She is ambulating, tolerating a regular diet, passing flatus, and urinating well. Patient is discharged home in stable condition on 01/13/22.  Newborn Data: Birth date:01/13/2022  Birth time:2:57 AM  Gender:Female  Living status:Living  Apgars:9 ,9  Weight:   Magnesium Sulfate received: BMZ received: No Rhophylac:N/A MMR:N/A T-DaP:{Tdap:23962} Flu: N/A Transfusion:No  Physical exam  Vitals:   01/12/22 2331 01/13/22 0134 01/13/22 0226 01/13/22 0318  BP: 114/78 98/63 114/72 113/75  Pulse: 89 96 88 97  Resp:      Temp:  98.5 F (36.9 C)    TempSrc:  Axillary    SpO2: 97%     Weight:      Height:       General: {Exam; general:21111117} Lochia: {Desc; appropriate/inappropriate:30686::"appropriate"} Uterine Fundus: {Desc; firm/soft:30687} Incision: {Exam; incision:21111123} DVT  Evaluation: {Exam; dvt:2111122} Labs: Lab Results  Component Value Date   WBC 10.1 01/13/2022   HGB 11.4 (L) 01/13/2022   HCT 34.0 (L) 01/13/2022   MCV 89.9 01/13/2022   PLT 257 01/13/2022      Latest Ref Rng & Units 02/15/2014    2:40 PM  CMP  Glucose 70 - 99 mg/dL 96   BUN 6 - 23 mg/dL 16   Creatinine 0.50 - 1.10 mg/dL 0.56   Sodium 135 - 145 mEq/L 140   Potassium 3.5 - 5.3 mEq/L 5.5   Chloride 96 - 112 mEq/L 105   CO2 19 - 32 mEq/L 27   Calcium 8.4 - 10.5 mg/dL 9.8   Total Protein 6.0 - 8.3 g/dL 7.2   Total Bilirubin 0.2 - 1.2 mg/dL 0.4   Alkaline Phos 39 - 117 U/L 59   AST 0 - 37 U/L 28   ALT 0 - 35 U/L 34    Edinburgh Score:    08/28/2019    7:35 PM  Edinburgh Postnatal Depression Scale Screening Tool  I have been able to laugh and see the funny side of things. 0  I have looked forward with enjoyment to things. 0  I have blamed myself unnecessarily when things went wrong. 0  I have been anxious or worried for no good reason. 0  I have felt scared or panicky for no good reason. 0  Things have been getting on top of me.  0  I have been so unhappy that I have had difficulty sleeping. 0  I have felt sad or miserable. 0  I have been so unhappy that I have been crying. 0  The thought of harming myself has occurred to me. 0  Edinburgh Postnatal Depression Scale Total 0      After visit meds:  Allergies as of 01/13/2022   No Known Allergies   Med Rec must be completed prior to using this [facility]***        Discharge home in stable condition Infant Feeding: Breast Infant Disposition:{CHL IP OB HOME WITH LMBEML:54492} Discharge instruction: per After Visit Summary and Postpartum booklet. Activity: Advance as tolerated. Pelvic rest for 6 weeks.  Diet: routine diet Anticipated Birth Control: Depo Postpartum Appointment:{Outpatient follow up:23559} Additional Postpartum F/U: {PP Procedure:23957} Future Appointments: Future Appointments  Date Time Provider  [place]  01/14/2022  8:35 AM Osborne Oman, MD [facility]  01/14/2022  3:45 PM [facility] US6 [facility]  02/01/2022 10:00 AM [facility] 1 [facility]  02/09/2022 12:40 PM Jaquita Folds, MD [facility]   Follow up Visit:      01/13/2022 Hansel Feinstein, CNM

## 2022-01-13 NOTE — H&P (Signed)
OBSTETRIC ADMISSION HISTORY AND PHYSICAL  Monica Bryan is a 31 y.o. female G3P2002 with IUP at [redacted]w[redacted]d by LMP presenting for latent labor, increasing contraction frequency. She reports +FMs, No LOF, no VB, no blurry vision, headaches or peripheral edema, and RUQ pain.  She plans on breast feeding. She request depo for birth control. She received her [facility]   Dating: By LMP --->  Estimated Date of Delivery: 01/23/22  Sono:    @[redacted]w[redacted]d , normal anatomy, cephalic presentation, 2409B, 13% EFW   Prenatal History/Complications:   Past Medical History: Past Medical History:  Diagnosis Date   Chlamydia    Family history of diabetes mellitus (DM) 02/15/2014   GERD (gastroesophageal reflux disease)    Headache    History of Bell's palsy    No pertinent past medical history     Past Surgical History: Past Surgical History:  Procedure Laterality Date   NO PAST SURGERIES      Obstetrical History: OB History     Gravida  3   Para  2   Term  2   Preterm      AB      Living  2      SAB      IAB      Ectopic      Multiple  0   Live Births  2           Social History Social History   Socioeconomic History   Marital status: Single    Spouse name: Not on file   Number of children: Not on file   Years of education: Not on file   Highest education level: Not on file  Occupational History   Not on file  Tobacco Use   Smoking status: Never   Smokeless tobacco: Never  Vaping Use   Vaping Use: Never used  Substance and Sexual Activity   Alcohol use: No    Alcohol/week: 0.0 standard drinks of alcohol   Drug use: No   Sexual activity: Yes    Birth control/protection: None  Other Topics Concern   Not on file  Social History Narrative   Not on file   Social Determinants of Health   Financial Resource Strain: Not on file  Food Insecurity: Food Insecurity Present (12/31/2021)   Hunger Vital Sign    Worried About Running Out of  Food in the Last Year: Sometimes true    Ran Out of Food in the Last Year: Never true  Transportation Needs: No Transportation Needs (12/31/2021)   PRAPARE - Hydrologist (Medical): No    Lack of Transportation (Non-Medical): No  Physical Activity: Not on file  Stress: Not on file  Social Connections: Not on file    Family History: Family History  Problem Relation Age of Onset   Diabetes Maternal Aunt    Heart disease Maternal Grandmother    Other Neg Hx     Allergies: No Known Allergies  Medications Prior to Admission  Medication Sig Dispense Refill Last Dose   acetaminophen (TYLENOL) 325 MG tablet Take 2 tablets (650 mg total) by mouth every 6 (six) hours as needed (for pain scale < 4). 30 tablet 0    Prenatal Vit-Fe Fumarate-FA (PRENATAL VITAMINS PO) Take 1 tablet by mouth daily.        Review of Systems   All systems reviewed and negative except as stated in HPI  Blood pressure 114/78, pulse 89, temperature 98.1  F (36.7 C), temperature source Oral, resp. rate 18, height 5\' 2"  (1.575 m), weight 56.3 kg, last menstrual period 04/18/2021, SpO2 97 %, unknown if currently breastfeeding. General appearance: alert, cooperative, and no distress Lungs: clear to auscultation bilaterally Heart: regular rate and rhythm Abdomen: soft, non-tender; bowel sounds normal Pelvic: Dilation: 7.5 Effacement (%): 80 Cervical Position: Middle Station: Plus 1 Presentation: Vertex Exam by:: [email or web address], RN  Extremities: Homans sign is negative, no sign of DVT DTR's normal  Presentation: cephalic Fetal monitoringBaseline: 190 bpm, Variability: Good {> 6 bpm), Accelerations: Reactive, and Decelerations: Variable: mild Uterine activityFrequency: Every 4 minutes Dilation: 7.5 Effacement (%): 80 Station: Plus 1 Exam by:: [email or web address], RN   Prenatal labs: ABO, Rh: O/Positive/-- (05/08 1611) Antibody: Negative (05/08 1611) Rubella: <0.90 (05/08  1611) RPR: Non Reactive (08/24 0927)  HBsAg: Negative (05/08 1611)  HIV: Non Reactive (08/24 0927)  GBS: Negative/-- (10/19 01-28-1991)  Genetic screening  normal Anatomy 9562 normal   Nursing Staff Provider  Office Location  [facility] Dating  01/23/22  [facility] Model [ X] Traditional [ ]  Centering [ ]  Mom-Baby Dyad    Language  Spanish Anatomy [facility]  Normal  Flu Vaccine  07/20/21 Genetic/Carrier Screen  NIPS: LR female  AFP:   wnl Horizon: Negative  TDaP Vaccine   11/05/2021 Hgb A1C or  GTT Early 4.9 Third trimester 89/111/78  COVID Vaccine Pfizer- 2 doses   LAB RESULTS   Rhogam  N/A Blood Type O/Positive/-- (05/08 1611)   Baby Feeding Plan Breast Antibody Negative (05/08 1611)  Contraception Depo Rubella <0.90 (05/08 1611) NON-IMMUNE  Circumcision NA RPR Non Reactive (05/08 1611)   Pediatrician  Kids Care HBsAg Negative (05/08 1611)   Support Person Primo(FOB) HCVAb Non Reactive (05/08 1611)  Prenatal Classes NA HIV Non Reactive (05/08 1611)     BTL Consent NA GBS   (For PCN allergy, check sensitivities)   VBAC Consent NA Pap  2/21 normal       DME Rx 04-27-1979 ] BP cuff [ ]  Weight Scale Waterbirth  [ ]  Class [ ]  Consent [ ]  CNM visit  PHQ9 & GAD7 [x]  new OB [  ] 28 weeks  [  ] 36 weeks Induction  [ ]  Orders Entered [ ] Foley Y/N    Prenatal Transfer Tool  Maternal Diabetes: No Genetic Screening: Normal Maternal Ultrasounds/Referrals: Normal Fetal Ultrasounds or other Referrals:  None Maternal Substance Abuse:  No Significant Maternal Medications:  None Significant Maternal Lab Results:  Group B Strep negative Number of Prenatal Visits:greater than 3 verified prenatal visits Other Comments:  None  Results for orders placed or performed during the hospital encounter of 01/12/22 (from the past 24 hour(s))  CBC   Collection Time: 01/13/22 12:43 AM  Result Value Ref Range   WBC 10.1 4.0 - 10.5 K/uL   RBC 3.78 (L) 3.87 - 5.11 MIL/uL   Hemoglobin 11.4 (L) 12.0 - 15.0 g/dL   HCT Arly.Keller (L)  - 46.0 %   MCV 89.9 80.0 - 100.0 fL   MCH 30.2 26.0 - 34.0 pg   MCHC 33.5 30.0 - 36.0 g/dL   RDW  - %   Platelets 257 150 - 400 K/uL   nRBC 0.0 0.0 - 0.2 %    Patient Active Problem List   Diagnosis Date Noted   Indication for care in labor and delivery, antepartum 01/13/2022   Language barrier 09/16/2021   Supervision of other normal pregnancy, antepartum 07/02/2021   Rubella  non-immune status, antepartum 05/10/2019    Assessment/Plan:  Monica Bryan is a 31 y.o. G3P2002 at [redacted]w[redacted]d here for SOL and fetal tachycardia.   #Labor: active labor, has made change to 7.5 cm dilated on admission from 3.5 in MAU. Contracting painfully. Suspect fetal tachycardia related to progression to active phase labor, no evidence of maternal infection.  #Pain: Declines analgesia #FWB: Category II, baseline 180 with intermittent variable #ID:  GBS neg #MOF: breast #MOC: Depo #Circ:  N/a  Margie Billet, MD  01/13/2022, 1:29 AM   Attestation:  I confirm that I have verified the information documented in the resident's note and that I have also personally reperformed the physical exam and all medical decision making activities.  I did my own exam and history with the patient.   Aviva Signs, CNM

## 2022-01-14 ENCOUNTER — Encounter: Payer: Self-pay | Admitting: Obstetrics & Gynecology

## 2022-01-14 ENCOUNTER — Ambulatory Visit: Payer: Self-pay

## 2022-01-14 MED ORDER — IBUPROFEN 600 MG PO TABS: 600.0000 mg | ORAL_TABLET | Freq: Four times a day (QID) | ORAL | 0 refills | Status: AC

## 2022-01-14 MED ORDER — ACETAMINOPHEN 325 MG PO TABS: 650.0000 mg | ORAL_TABLET | ORAL | 0 refills | Status: AC | PRN

## 2022-01-23 ENCOUNTER — Telehealth (HOSPITAL_COMMUNITY): Payer: Self-pay

## 2022-01-23 NOTE — Telephone Encounter (Signed)
Patient did not answer phone call. Voicemail left for patient.   Monica Bryan [facility] 01/23/2022,1840

## 2022-02-01 ENCOUNTER — Ambulatory Visit: Payer: Self-pay

## 2022-02-06 ENCOUNTER — Ambulatory Visit (HOSPITAL_COMMUNITY)
Admission: RE | Admit: 2022-02-06 | Discharge: 2022-02-06 | Disposition: A | Discharge status: Home / Self Care | Payer: Medicaid Other | Source: Ambulatory Visit | Attending: Obstetrics and Gynecology | Admitting: Obstetrics and Gynecology

## 2022-02-06 ENCOUNTER — Other Ambulatory Visit: Payer: Self-pay | Admitting: Obstetrics and Gynecology

## 2022-02-06 DIAGNOSIS — R350 Frequency of micturition: Secondary | ICD-10-CM

## 2022-02-06 DIAGNOSIS — N368 Other specified disorders of urethra: Secondary | ICD-10-CM | POA: Insufficient documentation

## 2022-02-09 ENCOUNTER — Encounter: Payer: Self-pay | Admitting: Obstetrics and Gynecology

## 2022-02-09 ENCOUNTER — Ambulatory Visit (INDEPENDENT_AMBULATORY_CARE_PROVIDER_SITE_OTHER): Payer: Medicaid Other | Admitting: Obstetrics and Gynecology

## 2022-02-09 VITALS — BP 108/78 | HR 89

## 2022-02-09 DIAGNOSIS — N368 Other specified disorders of urethra: Secondary | ICD-10-CM | POA: Diagnosis not present

## 2022-02-09 NOTE — Progress Notes (Signed)
[facility] Return Visit  SUBJECTIVE  History of Present Illness: Monica Bryan is a 31 y.o. female seen in follow-up for suburethral cyst. Of note, she had a NSVD on 01/13/22.  MRI pelvis 02/06/2022 IMPRESSION: 1. 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 2. Nonspecific 3 mm cystic focus at the level of the mid urethra may represent a small submucosal cyst or urethral diverticulum. 3. Postpartum appearance of the uterus.  No adnexal masses.  Past Medical History: Patient  has a past medical history of Chlamydia, Family history of diabetes mellitus (DM) (02/15/2014), GERD (gastroesophageal reflux disease), Headache, History of Bell's palsy, and No pertinent past medical history.   Past Surgical History: She  has a past surgical history that includes No past surgeries.   Medications: She has a current medication list which includes the following prescription(s): acetaminophen, ibuprofen, and prenatal vit-fe fumarate-fa.   Allergies: Patient has No Known Allergies.   Social History: Patient  reports that she has never smoked. She has never used smokeless tobacco. She reports that she does not drink alcohol and does not use drugs.      OBJECTIVE     Physical Exam: Vitals:   02/09/22 1256  BP: 108/78  Pulse: 89   Gen: No apparent distress, A&O x 3.  Detailed Urogynecologic Evaluation:  Deferred. Prior exam showed: 2cm cystic appearing suburethral mass, mobile, nontender, not compressible.     ASSESSMENT AND PLAN    Monica Bryan is a 31 y.o. with:  1. Skene's gland cyst    - Discussed removal of skene's gland cyst. We reviewed that the MRI does not show that this is connected to the urethra so the risk of urethral injury is low. She may still however need a catheter temporarily if she is  having difficulty voiding post operatively. Risks and benefits discussed.  - Reviewed need for pelvic rest for 6 weeks after the procedure.  - She would like to proceed with removal. She has [facility] financial assistance until the end of the month but will look into having it extended.   Request sent for surgery scheduling.   Marguerita Beards, MD

## 2022-02-23 ENCOUNTER — Ambulatory Visit: Payer: Self-pay | Admitting: Medical

## 2022-02-23 ENCOUNTER — Ambulatory Visit: Payer: Self-pay | Admitting: Family Medicine

## 2022-03-22 ENCOUNTER — Encounter: Payer: Self-pay | Admitting: Obstetrics and Gynecology

## 2022-03-22 ENCOUNTER — Ambulatory Visit (INDEPENDENT_AMBULATORY_CARE_PROVIDER_SITE_OTHER): Payer: Self-pay | Admitting: Obstetrics and Gynecology

## 2022-03-22 VITALS — BP 98/71 | HR 85 | Wt 103.0 lb

## 2022-03-22 DIAGNOSIS — N368 Other specified disorders of urethra: Secondary | ICD-10-CM

## 2022-03-22 DIAGNOSIS — Z01818 Encounter for other preprocedural examination: Secondary | ICD-10-CM

## 2022-03-22 MED ORDER — OXYCODONE HCL 5 MG PO TABS: 5.0000 mg | ORAL_TABLET | ORAL | 0 refills | Status: DC | PRN

## 2022-03-22 MED ORDER — IBUPROFEN 600 MG PO TABS: 600.0000 mg | ORAL_TABLET | Freq: Four times a day (QID) | ORAL | 0 refills | Status: DC | PRN

## 2022-03-22 MED ORDER — ACETAMINOPHEN 500 MG PO TABS: 500.0000 mg | ORAL_TABLET | Freq: Four times a day (QID) | ORAL | 0 refills | Status: DC | PRN

## 2022-03-22 NOTE — Progress Notes (Signed)
[facility] Pre-Operative visit  Subjective Chief Complaint: Monica Bryan presents for a preoperative encounter.   History of Present Illness: Monica Bryan is a 32 y.o. female who presents for preoperative visit.  She is scheduled to undergo Excision of skene's gland cyst, cystoscopy on 04/05/22.  Her symptoms include suburethral mass. MRI showed:  IMPRESSION: 1. 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 2. Nonspecific 3 mm cystic focus at the level of the mid urethra may represent a small submucosal cyst or urethral diverticulum. 3. Postpartum appearance of the uterus.  No adnexal masses.  Past Medical History:  Diagnosis Date   Chlamydia    Family history of diabetes mellitus (DM) 02/15/2014   GERD (gastroesophageal reflux disease)    Headache    History of Bell's palsy    No pertinent past medical history      Past Surgical History:  Procedure Laterality Date   NO PAST SURGERIES      has No Known Allergies.   Family History  Problem Relation Age of Onset   Diabetes Maternal Aunt    Heart disease Maternal Grandmother    Other Neg Hx     Social History   Tobacco Use   Smoking status: Never   Smokeless tobacco: Never  Vaping Use   Vaping Use: Never used  Substance Use Topics   Alcohol use: No    Alcohol/week: 0.0 standard drinks of alcohol   Drug use: No     Review of Systems was negative for a full 10 system review except as noted in the History of Present Illness.   Current Outpatient Medications:    acetaminophen (TYLENOL) 325 MG tablet, Take 2 tablets (650 mg total) by mouth every 4 (four) hours as needed (for pain scale < 4)., Disp: 30 tablet, Rfl: 0   ibuprofen (ADVIL) 600 MG tablet, Take 1 tablet (600 mg total) by mouth every 6 (six) hours., Disp: 30  tablet, Rfl: 0   Prenatal Vit-Fe Fumarate-FA (PRENATAL VITAMINS PO), Take 1 tablet by mouth daily., Disp: , Rfl:    Objective There were no vitals filed for this visit.  Gen: NAD CV: S1 S2 RRR Lungs: Clear to auscultation bilaterally Abd: soft, nontender   Previous Pelvic Exam showed: 2cm cystic appearing suburethral mass, mobile, nontender, not compressible.      Assessment/ Plan  Assessment: The patient is a 32 y.o. year old scheduled to undergo Excision of skene's gland cyst, cystoscopy. Verbal consent was obtained for these procedures.  Plan: General Surgical Consent: The patient has previously been counseled on alternative treatments, and the decision by the patient and provider was to proceed with the procedure listed above.  For all procedures, there are risks of bleeding, infection, damage to surrounding organs including but not limited to bowel, bladder, blood vessels, ureters and nerves, and need for further surgery if an injury were to occur. These risks are all low with minimally invasive surgery.   There are risks of numbness and weakness at any body site or buttock/rectal pain.  It is possible that baseline pain can be worsened by surgery, either with or without mesh. If surgery is vaginal, there is also a low risk of possible conversion to laparoscopy or open abdominal incision where indicated. Very rare risks include blood transfusion, blood clot, heart attack, pneumonia, or death.   There is also a risk  of short-term postoperative urinary retention with need to use a catheter. About half of patients need to go home from surgery with a catheter, which is then later removed in the office. The risk of long-term need for a catheter is very low. There is also a risk of worsening of overactive bladder.   We discussed consent for blood products. Risks for blood transfusion include allergic reactions, other reactions that can affect different body organs and managed  accordingly, transmission of infectious diseases such as HIV or Hepatitis. However, the blood is screened. Patient consents for blood products.  Pre-operative instructions:  She was instructed to not take Aspirin/NSAIDs x 7days prior to surgery. Antibiotic prophylaxis was ordered as indicated.  Catheter use: Patient will go home with foley if needed after post-operative voiding trial.  Post-operative instructions:  She was provided with specific post-operative instructions, including precautions and signs/symptoms for which we would recommend contacting us, in addition to daytime and after-hours contact phone numbers. This was provided on a handout.   Post-operative medications: Prescriptions for motrin, tylenol, and oxycodone were sent to her pharmacy. Discussed using ibuprofen and tylenol on a schedule to limit use of narcotics.   Laboratory testing:  No labs needed  Preoperative clearance:  She does not require surgical clearance.    Post-operative follow-up:  A post-operative appointment will be made for 6 weeks from the date of surgery. If she needs a post-operative nurse visit for a voiding trial, that will be set up after she leaves the hospital.    Patient will call the clinic or use MyChart should anything change or any new issues arise.   Marguerita Beards, MD  Time spent: I spent 20 minutes dedicated to the care of this patient on the date of this encounter to include pre-visit review of records, face-to-face time with the patient and post visit documentation and ordering medication/ testing.

## 2022-03-22 NOTE — Patient Instructions (Addendum)
Surgery Location: [facility] ([place], [place], [place] [address])   Do not take NSAIDS (ibuprofen, naproxen, advil, aleve, motrin, or >81mg  aspirin) for 7 days prior to surgery. If you take a baby aspirin (81mg ), you can continue this up until surgery.   No eating or drinking after midnight on the day of your surgery (unless otherwise instructed by pre-op nurse). If you need to take medications in the morning, take with a small sip of water.   If labs were ordered for you, they will be done at your Pre-op appointment at the hospital.   Medications have been sent to your pharmacy (see your AVS for your list of medications). Please pick up these medications prior to your scheduled surgery so you have them available after. DO NOT take the medications until after your surgery unless otherwise instructed.    No tome AINE (ibuprofeno, naproxeno, advil, aleve, motrin o >81 mg de aspirina) durante los 7 das previos a la [place]. Si toma aspirina para bebs (81 mg), puede continuar tomndola [employer].   No coma ni beba despus de la medianoche del da de su ciruga (a menos que la enfermera preoperatoria le indique lo contrario). Si necesita tomar medicamentos por la maana, tmelo con un pequeo sorbo de agua.    Los medicamentos han sido enviados a su farmacia (consulte su AVS para obtener la lista de medicamentos). Recoja estos medicamentos antes de su ciruga programada para tenerlos disponibles despus. NO tome los medicamentos hasta despus de la ciruga a menos que se le indique lo contrario.

## 2022-03-23 NOTE — H&P (Signed)
[facility] Pre-Operative H&P  Subjective Chief Complaint: Monica Bryan presents for a preoperative encounter.   History of Present Illness: Monica Bryan is a 32 y.o. female who presents for preoperative visit.  She is scheduled to undergo Excision of skene's gland cyst, cystoscopy on 04/05/22.  Her symptoms include suburethral mass. MRI showed:  IMPRESSION: 1. 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 2. Nonspecific 3 mm cystic focus at the level of the mid urethra may represent a small submucosal cyst or urethral diverticulum. 3. Postpartum appearance of the uterus.  No adnexal masses.  Past Medical History:  Diagnosis Date   Chlamydia    Family history of diabetes mellitus (DM) 02/15/2014   GERD (gastroesophageal reflux disease)    Headache    History of Bell's palsy    No pertinent past medical history      Past Surgical History:  Procedure Laterality Date   NO PAST SURGERIES      has No Known Allergies.   Family History  Problem Relation Age of Onset   Diabetes Maternal Aunt    Heart disease Maternal Grandmother    Other Neg Hx     Social History   Tobacco Use   Smoking status: Never   Smokeless tobacco: Never  Vaping Use   Vaping Use: Never used  Substance Use Topics   Alcohol use: No    Alcohol/week: 0.0 standard drinks of alcohol   Drug use: No     Review of Systems was negative for a full 10 system review except as noted in the History of Present Illness.  No current facility-administered medications for this encounter.  Current Outpatient Medications:    acetaminophen (TYLENOL) 325 MG tablet, Take 2 tablets (650 mg total) by mouth every 4 (four) hours as needed (for pain scale < 4)., Disp: 30 tablet, Rfl: 0   acetaminophen (TYLENOL) 500 MG tablet, Take  1 tablet (500 mg total) by mouth every 6 (six) hours as needed (pain)., Disp: 30 tablet, Rfl: 0   ibuprofen (ADVIL) 600 MG tablet, Take 1 tablet (600 mg total) by mouth every 6 (six) hours., Disp: 30 tablet, Rfl: 0   ibuprofen (ADVIL) 600 MG tablet, Take 1 tablet (600 mg total) by mouth every 6 (six) hours as needed., Disp: 30 tablet, Rfl: 0   oxyCODONE (OXY IR/ROXICODONE) 5 MG immediate release tablet, Take 1 tablet (5 mg total) by mouth every 4 (four) hours as needed for severe pain., Disp: 5 tablet, Rfl: 0   Prenatal Vit-Fe Fumarate-FA (PRENATAL VITAMINS PO), Take 1 tablet by mouth daily., Disp: , Rfl:    Objective There were no vitals filed for this visit.  Gen: NAD CV: S1 S2 RRR Lungs: Clear to auscultation bilaterally Abd: soft, nontender   Previous Pelvic Exam showed: 2cm cystic appearing suburethral mass, mobile, nontender, not compressible.      Assessment/ Plan  The patient is a 32 y.o. year old scheduled to undergo Excision of skene's gland cyst, cystoscopy.    Jaquita Folds, MD

## 2022-03-30 ENCOUNTER — Encounter (HOSPITAL_BASED_OUTPATIENT_CLINIC_OR_DEPARTMENT_OTHER): Payer: Self-pay | Admitting: Obstetrics and Gynecology

## 2022-03-30 NOTE — Progress Notes (Signed)
Spoke w/ via phone for pre-op interview--- pt thru [facility] interpreter ID # [phone] Lab needs dos----  urine preg             Lab results------ no COVID test -----patient states asymptomatic no test needed Arrive at ------- 1100 on 04-05-2022 NPO after MN NO Solid Food.  Clear liquids from MN until--- 1000 Med rec completed Medications to take morning of surgery ----- none Diabetic medication ----- Patient instructed no nail polish to be worn day of surgery Patient instructed to bring photo id and insurance card day of surgery Patient aware to have Driver (ride ) / caregiver    for 24 hours after surgery -- sig other, primo Patient Special Instructions ----- n/a Pre-Op special Istructions ----- requested spanish interpreter via email to [facility] interpreting. Copy with chart. Patient verbalized understanding of instructions that were given at this phone interview. Patient denies shortness of breath, chest pain, fever, cough at this phone interview.

## 2022-04-03 NOTE — Anesthesia Preprocedure Evaluation (Addendum)
Anesthesia Evaluation  Patient identified by MRN, date of birth, ID band Patient awake    Reviewed: Allergy & Precautions, NPO status , Patient's Chart, lab work & pertinent test results  History of Anesthesia Complications Negative for: history of anesthetic complications  Airway Mallampati: II  TM Distance: >3 FB Neck ROM: Full    Dental no notable dental hx.    Pulmonary neg pulmonary ROS   Pulmonary exam normal        Cardiovascular negative cardio ROS Normal cardiovascular exam     Neuro/Psych  Headaches    GI/Hepatic Neg liver ROS,GERD  Controlled,,  Endo/Other  negative endocrine ROS    Renal/GU negative Renal ROS     Musculoskeletal negative musculoskeletal ROS (+)    Abdominal   Peds  Hematology negative hematology ROS (+)   Anesthesia Other Findings   Reproductive/Obstetrics (+) Breast feeding  skenes gland cyst                              Anesthesia Physical Anesthesia Plan  ASA: 2  Anesthesia Plan: General   Post-op Pain Management: Tylenol PO (pre-op)* and Toradol IV (intra-op)*   Induction: Intravenous  PONV Risk Score and Plan: 3 and Treatment may vary due to age or medical condition, Ondansetron, Dexamethasone, Midazolam, Propofol infusion and TIVA  Airway Management Planned: LMA  Additional Equipment: None  Intra-op Plan:   Post-operative Plan: Extubation in OR  Informed Consent: I have reviewed the patients History and Physical, chart, labs and discussed the procedure including the risks, benefits and alternatives for the proposed anesthesia with the patient or authorized representative who has indicated his/her understanding and acceptance.     Dental advisory given  Plan Discussed with: CRNA  Anesthesia Plan Comments:         Anesthesia Quick Evaluation

## 2022-04-05 ENCOUNTER — Ambulatory Visit (HOSPITAL_BASED_OUTPATIENT_CLINIC_OR_DEPARTMENT_OTHER)
Admission: RE | Admit: 2022-04-05 | Discharge: 2022-04-05 | Disposition: A | Discharge status: Home / Self Care | Payer: Self-pay | Attending: Obstetrics and Gynecology | Admitting: Obstetrics and Gynecology

## 2022-04-05 ENCOUNTER — Telehealth: Payer: Self-pay | Admitting: Obstetrics and Gynecology

## 2022-04-05 ENCOUNTER — Ambulatory Visit (HOSPITAL_BASED_OUTPATIENT_CLINIC_OR_DEPARTMENT_OTHER): Payer: Self-pay | Admitting: Anesthesiology

## 2022-04-05 ENCOUNTER — Other Ambulatory Visit: Payer: Self-pay

## 2022-04-05 ENCOUNTER — Encounter (HOSPITAL_BASED_OUTPATIENT_CLINIC_OR_DEPARTMENT_OTHER)
Admission: RE | Disposition: A | Discharge status: Home / Self Care | Payer: Self-pay | Source: Home / Self Care | Attending: Obstetrics and Gynecology

## 2022-04-05 ENCOUNTER — Encounter (HOSPITAL_BASED_OUTPATIENT_CLINIC_OR_DEPARTMENT_OTHER): Payer: Self-pay | Admitting: Obstetrics and Gynecology

## 2022-04-05 DIAGNOSIS — K219 Gastro-esophageal reflux disease without esophagitis: Secondary | ICD-10-CM | POA: Insufficient documentation

## 2022-04-05 DIAGNOSIS — N368 Other specified disorders of urethra: Secondary | ICD-10-CM

## 2022-04-05 DIAGNOSIS — Z01818 Encounter for other preprocedural examination: Secondary | ICD-10-CM

## 2022-04-05 HISTORY — DX: Other specified disorders of urethra: N36.8

## 2022-04-05 HISTORY — DX: Personal history of other infectious and parasitic diseases: Z86.19

## 2022-04-05 HISTORY — PX: CYSTOSCOPY: SHX5120

## 2022-04-05 HISTORY — PX: URETHRAL CYST REMOVAL: SHX5128

## 2022-04-05 LAB — POCT PREGNANCY, URINE: Preg Test, Ur: NEGATIVE

## 2022-04-05 SURGERY — EXCISION, CYST, PERIURETHRAL
Anesthesia: General | Site: Urethra

## 2022-04-05 MED ORDER — ACETAMINOPHEN 500 MG PO TABS
1000.0000 mg | ORAL_TABLET | Freq: Once | ORAL | Status: AC
  Administered 2022-04-05: 1000 mg via ORAL

## 2022-04-05 MED ORDER — WHITE PETROLATUM EX OINT
TOPICAL_OINTMENT | CUTANEOUS | Status: AC
  Filled 2022-04-05: qty 5

## 2022-04-05 MED ORDER — CEFAZOLIN SODIUM-DEXTROSE 2-4 GM/100ML-% IV SOLN: 2.0000 g | INTRAVENOUS | Status: DC

## 2022-04-05 MED ORDER — FENTANYL CITRATE (PF) 100 MCG/2ML IJ SOLN
INTRAMUSCULAR | Status: DC | PRN
  Administered 2022-04-05: 50 ug via INTRAVENOUS

## 2022-04-05 MED ORDER — SODIUM CHLORIDE 0.9 % IR SOLN
Status: DC | PRN
  Administered 2022-04-05: 1000 mL via INTRAVESICAL

## 2022-04-05 MED ORDER — AMISULPRIDE (ANTIEMETIC) 5 MG/2ML IV SOLN: 10.0000 mg | Freq: Once | INTRAVENOUS | Status: DC | PRN

## 2022-04-05 MED ORDER — ONDANSETRON HCL 4 MG/2ML IJ SOLN
INTRAMUSCULAR | Status: AC
  Filled 2022-04-05: qty 2

## 2022-04-05 MED ORDER — POVIDONE-IODINE 10 % EX SWAB: 2.0000 | Freq: Once | CUTANEOUS | Status: DC

## 2022-04-05 MED ORDER — MIDAZOLAM HCL 2 MG/2ML IJ SOLN
INTRAMUSCULAR | Status: DC | PRN
  Administered 2022-04-05: 1 mg via INTRAVENOUS

## 2022-04-05 MED ORDER — PHENAZOPYRIDINE HCL 100 MG PO TABS
ORAL_TABLET | ORAL | Status: AC
  Filled 2022-04-05: qty 2

## 2022-04-05 MED ORDER — DEXAMETHASONE SODIUM PHOSPHATE 10 MG/ML IJ SOLN
INTRAMUSCULAR | Status: DC | PRN
  Administered 2022-04-05: 5 mg via INTRAVENOUS

## 2022-04-05 MED ORDER — FENTANYL CITRATE (PF) 100 MCG/2ML IJ SOLN: 25.0000 ug | INTRAMUSCULAR | Status: DC | PRN

## 2022-04-05 MED ORDER — LIDOCAINE 2% (20 MG/ML) 5 ML SYRINGE
INTRAMUSCULAR | Status: DC | PRN
  Administered 2022-04-05: 60 mg via INTRAVENOUS

## 2022-04-05 MED ORDER — LIDOCAINE-EPINEPHRINE 1 %-1:100000 IJ SOLN
INTRAMUSCULAR | Status: DC | PRN
  Administered 2022-04-05: 3 mL

## 2022-04-05 MED ORDER — SCOPOLAMINE 1 MG/3DAYS TD PT72
MEDICATED_PATCH | TRANSDERMAL | Status: AC
  Filled 2022-04-05: qty 1

## 2022-04-05 MED ORDER — KETOROLAC TROMETHAMINE 30 MG/ML IJ SOLN
INTRAMUSCULAR | Status: DC | PRN
  Administered 2022-04-05: 30 mg via INTRAVENOUS

## 2022-04-05 MED ORDER — ONDANSETRON HCL 4 MG/2ML IJ SOLN
INTRAMUSCULAR | Status: DC | PRN
  Administered 2022-04-05: 4 mg via INTRAVENOUS

## 2022-04-05 MED ORDER — LIDOCAINE HCL (PF) 2 % IJ SOLN
INTRAMUSCULAR | Status: AC
  Filled 2022-04-05: qty 5

## 2022-04-05 MED ORDER — PROPOFOL 1000 MG/100ML IV EMUL
INTRAVENOUS | Status: AC
  Filled 2022-04-05: qty 100

## 2022-04-05 MED ORDER — 0.9 % SODIUM CHLORIDE (POUR BTL) OPTIME
TOPICAL | Status: DC | PRN
  Administered 2022-04-05: 500 mL

## 2022-04-05 MED ORDER — PROPOFOL 10 MG/ML IV BOLUS
INTRAVENOUS | Status: AC
  Filled 2022-04-05: qty 20

## 2022-04-05 MED ORDER — PROPOFOL 500 MG/50ML IV EMUL
INTRAVENOUS | Status: DC | PRN
  Administered 2022-04-05: 200 ug/kg/min via INTRAVENOUS

## 2022-04-05 MED ORDER — CEFAZOLIN SODIUM-DEXTROSE 2-4 GM/100ML-% IV SOLN
INTRAVENOUS | Status: AC
  Filled 2022-04-05: qty 100

## 2022-04-05 MED ORDER — MIDAZOLAM HCL 2 MG/2ML IJ SOLN
INTRAMUSCULAR | Status: AC
  Filled 2022-04-05: qty 2

## 2022-04-05 MED ORDER — LACTATED RINGERS IV SOLN: INTRAVENOUS | Status: DC

## 2022-04-05 MED ORDER — OXYCODONE HCL 5 MG PO TABS: 5.0000 mg | ORAL_TABLET | Freq: Once | ORAL | Status: DC | PRN

## 2022-04-05 MED ORDER — KETOROLAC TROMETHAMINE 30 MG/ML IJ SOLN
INTRAMUSCULAR | Status: AC
  Filled 2022-04-05: qty 1

## 2022-04-05 MED ORDER — OXYCODONE HCL 5 MG/5ML PO SOLN: 5.0000 mg | Freq: Once | ORAL | Status: DC | PRN

## 2022-04-05 MED ORDER — DEXAMETHASONE SODIUM PHOSPHATE 10 MG/ML IJ SOLN
INTRAMUSCULAR | Status: AC
  Filled 2022-04-05: qty 1

## 2022-04-05 MED ORDER — ACETAMINOPHEN 500 MG PO TABS
ORAL_TABLET | ORAL | Status: AC
  Filled 2022-04-05: qty 2

## 2022-04-05 MED ORDER — PROPOFOL 10 MG/ML IV BOLUS
INTRAVENOUS | Status: DC | PRN
  Administered 2022-04-05: 100 mg via INTRAVENOUS

## 2022-04-05 MED ORDER — FENTANYL CITRATE (PF) 100 MCG/2ML IJ SOLN
INTRAMUSCULAR | Status: AC
  Filled 2022-04-05: qty 2

## 2022-04-05 SURGICAL SUPPLY — 31 items
BLADE CLIPPER SENSICLIP SURGIC (BLADE) ×2 IMPLANT
BLADE SURG 15 STRL LF DISP TIS (BLADE) ×2 IMPLANT
BLADE SURG 15 STRL SS (BLADE) ×2
ELECT REM PT RETURN 9FT ADLT (ELECTROSURGICAL) ×2
ELECTRODE REM PT RTRN 9FT ADLT (ELECTROSURGICAL) IMPLANT
GAUZE 4X4 16PLY ~~LOC~~+RFID DBL (SPONGE) IMPLANT
GLOVE BIOGEL PI IND STRL 6.5 (GLOVE) ×2 IMPLANT
GLOVE ECLIPSE 6.0 STRL STRAW (GLOVE) ×2 IMPLANT
GOWN STRL REUS W/TWL LRG LVL3 (GOWN DISPOSABLE) ×2 IMPLANT
HIBICLENS CHG 4% 4OZ BTL (MISCELLANEOUS) ×2 IMPLANT
HOLDER FOLEY CATH W/STRAP (MISCELLANEOUS) ×2 IMPLANT
KIT TURNOVER CYSTO (KITS) ×2 IMPLANT
MANIFOLD NEPTUNE II (INSTRUMENTS) ×2 IMPLANT
NEEDLE HYPO 22GX1.5 SAFETY (NEEDLE) ×2 IMPLANT
NS IRRIG 1000ML POUR BTL (IV SOLUTION) ×2 IMPLANT
PACK CYSTO (CUSTOM PROCEDURE TRAY) ×2 IMPLANT
PACK VAGINAL WOMENS (CUSTOM PROCEDURE TRAY) ×2 IMPLANT
RETRACTOR LONE STAR DISPOSABLE (INSTRUMENTS) ×2 IMPLANT
RETRACTOR STAY HOOK 5MM (MISCELLANEOUS) ×2 IMPLANT
SET IRRIG Y TYPE TUR BLADDER L (SET/KITS/TRAYS/PACK) IMPLANT
SPIKE FLUID TRANSFER (MISCELLANEOUS) IMPLANT
SPOGE SURGIFLO 8M (HEMOSTASIS)
SPONGE SURGIFLO 8M (HEMOSTASIS) IMPLANT
SURGIFLO W/THROMBIN 8M KIT (HEMOSTASIS) IMPLANT
SUT VIC AB 2-0 SH 18 (SUTURE) ×2 IMPLANT
SUT VIC AB 2-0 SH 27 (SUTURE)
SUT VIC AB 2-0 SH 27XBRD (SUTURE) IMPLANT
SUT VICRYL 2-0 SH 8X27 (SUTURE) IMPLANT
SYR BULB EAR ULCER 3OZ GRN STR (SYRINGE) ×2 IMPLANT
TOWEL OR 17X26 10 PK STRL BLUE (TOWEL DISPOSABLE) ×2 IMPLANT
TRAY FOLEY W/BAG SLVR 14FR LF (SET/KITS/TRAYS/PACK) ×2 IMPLANT

## 2022-04-05 NOTE — Op Note (Signed)
Operative Note  Preoperative Diagnosis:  skene's gland cyst  Postoperative Diagnosis: same  Procedures performed:  Excision of skene's gland cyst, cystoscopy  Implants: none  Attending Surgeon: Sherlene Shams, MD  Anesthesia: General LMA  Findings: 1. On vaginal exam, 1.5cm left sided suburethral csyt present.   2. On cystoscopy, normal bladder and urethra without injury or lesion present.     Specimens:  ID Type Source Tests Collected by Time Destination  1 : Sub-Urethral Cyst Tissue PATH GU biopsy SURGICAL PATHOLOGY Jaquita Folds, MD 04/05/2022 1309     Estimated blood loss: 20 mL  IV fluids: see flowsheet  Urine output: negligible  Complications: none  Procedure in Detail:  After informed consent was obtained, the patient was taken to the operating room where general anesthesia was induced and found to be adequate.  She was placed in dorsolithotomy position, taking care to avoid any nerve injury, and then prepped and draped in the usual sterile fashion. A foley catheter was placed.  On inspection a 1.5 cm left sided suburethral cyst was noted.  Allis clamps were placed along the anterior vaginal wall adjacent to the cyst.  Percent lidocaine with epinephrine was injected circumferentially around the cyst.  A 15 blade scalpel was used to make an incision along the anterior vagina adjacent to the cyst.  Metzenbaum scissors were then used to dissect the vaginal epithelium off of the cyst wall.  The cyst wall was entered and dark hemorrhagic fluid was extruded from the cyst.  Careful dissection was further performed to remove the entire cyst wall taking care to avoid the urethra.  The cyst was removed and sent to pathology.  Catheter was removed.  Cystoscopy was then performed with a 70 degree cystoscope.  A 360 degree view of the bladder was obtained and bladder mucosa appeared normal.  Urethroscopy was performed while removing the cystoscope and urethra appeared intact.   The incision was then closed with a 2-0 Vicryl in a running fashion and the subcutaneous tissue.  Good hemostasis was noted.  The epithelium was then closed with 2-0 Vicryl suture in a running fashion.  Stasis was again noted after irrigation.  The patient tolerated the procedure well and was awoken and taken to the recovery room in stable condition needle and sponge counts were correct x 2.  Jaquita Folds, MD

## 2022-04-05 NOTE — Anesthesia Procedure Notes (Signed)
Procedure Name: LMA Insertion Date/Time: 04/05/2022 12:50 PM  Performed by: Suan Halter, CRNAPre-anesthesia Checklist: Patient identified, Emergency Drugs available, Suction available and Patient being monitored Patient Re-evaluated:Patient Re-evaluated prior to induction Oxygen Delivery Method: Circle system utilized Preoxygenation: Pre-oxygenation with 100% oxygen Induction Type: IV induction Ventilation: Mask ventilation without difficulty LMA: LMA inserted LMA Size: 4.0 Number of attempts: 1 Airway Equipment and Method: Bite block Placement Confirmation: positive ETCO2 Tube secured with: Tape Dental Injury: Teeth and Oropharynx as per pre-operative assessment

## 2022-04-05 NOTE — Telephone Encounter (Signed)
Monica Bryan underwent skene's gland cyst excision and cystoscopy on 04/05/22.   She passed her voiding trial.  311ml was backfilled into the bladder Voided 269ml  PVR by bladder scan was 27ml.   She was discharged without a catheter. Please call her for a routine post op check. Thanks!  Jaquita Folds, MD

## 2022-04-05 NOTE — Transfer of Care (Signed)
Immediate Anesthesia Transfer of Care Note  Patient: Shauntea Lok Dubon-Garcia  Procedure(s) Performed: EXCISION URETHRAL CYST (skene's gland cyst) (Urethra) CYSTOSCOPY (Bladder)  Patient Location: PACU  Anesthesia Type:General  Level of Consciousness: awake, alert , and oriented  Airway & Oxygen Therapy: Patient Spontanous Breathing  Post-op Assessment: Report given to RN and Post -op Vital signs reviewed and stable  Post vital signs: Reviewed and stable  Last Vitals:  Vitals Value Taken Time  BP    Temp    Pulse 76 04/05/22 1331  Resp 15 04/05/22 1331  SpO2 99 % 04/05/22 1331  Vitals shown include unvalidated device data.  Last Pain:  Vitals:   04/05/22 1135  TempSrc: Oral  PainSc: 0-No pain      Patients Stated Pain Goal: 5 (92/44/62 [id number])  Complications: No notable events documented.

## 2022-04-05 NOTE — Anesthesia Postprocedure Evaluation (Signed)
Anesthesia Post Note  Patient: Monica Bryan  Procedure(s) Performed: EXCISION URETHRAL CYST (skene's gland cyst) (Urethra) CYSTOSCOPY (Bladder)     Patient location during evaluation: PACU Anesthesia Type: General Level of consciousness: awake and alert Pain management: pain level controlled Vital Signs Assessment: post-procedure vital signs reviewed and stable Respiratory status: spontaneous breathing, nonlabored ventilation and respiratory function stable Cardiovascular status: blood pressure returned to baseline Postop Assessment: no apparent nausea or vomiting Anesthetic complications: no   No notable events documented.  Last Vitals:  Vitals:   04/05/22 1330 04/05/22 1345  BP: 110/63 110/69  Pulse: 83 80  Resp: 16 18  Temp: 36.9 C   SpO2: 99% 99%    Last Pain:  Vitals:   04/05/22 1330  TempSrc:   PainSc: 0-No pain                 Marthenia Rolling

## 2022-04-05 NOTE — Discharge Instructions (Addendum)
POST OPERATIVE INSTRUCTIONS  General Instructions Recovery (not bed rest) will last approximately 2-3 weeks Walking is encouraged, but refrain from strenuous exercise/ housework/ heavy lifting for 2 days. Nothing in the vagina- NO intercourse, tampons or douching for 6 weeks Bathing:  Do not submerge in water (NO swimming, bath, hot tub, etc) until after your postop visit. You can shower starting the day after surgery.  No driving until you are not taking narcotic pain medicine and until your pain is well enough controlled that you can slam on the breaks or make sudden movements if needed.   Taking your medications Please take your acetaminophen and ibuprofen on a schedule for the first 48 hours. Take 600mg  ibuprofen, then take 500mg  acetaminophen 3 hours later, then continue to alternate ibuprofen and acetaminophen. That way you are taking each type of medication every 6 hours. Take the prescribed narcotic (oxycodone, tramadol, etc) as needed, with a maximum being every 4 hours.  Take a stool softener daily to keep your stools soft and preventing you from straining. If you have diarrhea, you decrease your stool softener. This is explained more below. We have prescribed you Miralax.  Reasons to Call the Nurse (see last page for phone numbers) Heavy Bleeding (changing your pad every 1-2 hours) Persistent nausea/vomiting Fever (100.4 degrees or more) Incision problems (pus or other fluid coming out, redness, warmth, increased pain)  Things to Expect After Surgery Mild to Moderate pain is normal during the first day or two after surgery. If prescribed, take Ibuprofen or Tylenol first and use the stronger medicine for "break-through" pain. You can overlap these medicines because they work differently.   Constipation   To Prevent Constipation:  Eat a well-balanced diet including protein, grains, fresh fruit and vegetables.  Drink plenty of fluids. Walk regularly.  Depending on specific  instructions from your physician: take Miralax daily and additionally you can add a stool softener (colace/ docusate) and fiber supplement. Continue as long as you're on pain medications.   To Treat Constipation:  If you do not have a bowel movement in 2 days after surgery, you can take 2 Tbs of Milk of Magnesia 1-2 times a day until you have a bowel movement. If diarrhea occurs, decrease the amount or stop the laxative. If no results with Milk of Magnesia, you can drink a bottle of magnesium citrate which you can purchase over the counter.  Fatigue:  This is a normal response to surgery and will improve with time.  Plan frequent rest periods throughout the day.  Gas Pain:  This is very common but can also be very painful! Drink warm liquids such as herbal teas, bouillon or soup. Walking will help you pass more gas.  Mylicon or Gas-X can be taken over the counter.  Leaking Urine:  Varying amounts of leakage may occur after surgery.  This should improve with time. Your bladder needs at least 3 months to recover from surgery. If you leak after surgery, be sure to mention this to your doctor at your post-op visit. If you were taking medications for overactive bladder prior to surgery, be sure to restart the medications immediately after surgery.  Incisions: If you have incisions on your abdomen, the skin glue will dissolve on its own over time. It is ok to gently rinse with soap and water over these incisions but do not scrub.  Catheter Approximately 50% of patients are unable to urinate after surgery and need to go home with a catheter. This allows your  bladder to rest so it can return to full function. If you go home with a catheter, the office will call to set up a voiding trial a few days after surgery. For most patients, by this visit, they are able to urinate on their own. Long term catheter use is rare.   Return to Work  As work demands and recovery times vary widely, it is hard to predict when  you will want to return to work. If you have a desk job with no strenuous physical activity, and if you would like to return sooner than generally recommended, discuss this with your provider or call our office.   Post op concerns  For non-emergent issues, please call the Urogynecology Nurse. Please leave a message and someone will contact you within one business day.  You can also send a message through [facility].   AFTER HOURS (After 5:00 PM and on weekends):  For urgent matters that cannot wait until the next business day. Call our office [phone] and connect to the doctor on call.  Please reserve this for important issues.   **FOR ANY TRUE EMERGENCY ISSUES CALL 911 OR GO TO THE NEAREST EMERGENCY ROOM.** Please inform our office or the doctor on call of any emergency.     APPOINTMENTS: Call [phone]

## 2022-04-05 NOTE — Interval H&P Note (Signed)
History and Physical Interval Note:  04/05/2022 12:21 PM  Monica Bryan  has presented today for surgery, with the diagnosis of skenes gland cyst.  The various methods of treatment have been discussed with the patient and family. After consideration of risks, benefits and other options for treatment, the patient has consented to  Procedure(s) with comments: EXCISION URETHRAL CYST (skene's gland cyst) (N/A)  CYSTOSCOPY (N/A) as a surgical intervention.  The patient's history has been reviewed, patient examined, no change in status, stable for surgery.  I have reviewed the patient's chart and labs.  Questions were answered to the patient's satisfaction.     Jaquita Folds

## 2022-04-06 ENCOUNTER — Encounter (HOSPITAL_BASED_OUTPATIENT_CLINIC_OR_DEPARTMENT_OTHER): Payer: Self-pay | Admitting: Obstetrics and Gynecology

## 2022-04-06 LAB — SURGICAL PATHOLOGY

## 2022-04-06 NOTE — Telephone Encounter (Signed)
Spanish Interpreter used for call: ID [phone]  Patient reports she is doing well. Pain 5-6/10 and taking tylenol as needed.  Reports yesterday she used 2 pads for bleeding but today has only seen scant amounts of blood when wiping and denies blood clots.  Has not yet had a BM and plans to use Miralax in the coming days to assist in this.  Able to urinate normally and without pain.  Patient denies any concerns at this time and reports she will call the office if she has any concerns.

## 2022-05-03 ENCOUNTER — Encounter: Payer: Self-pay | Admitting: *Deleted

## 2022-05-18 ENCOUNTER — Encounter: Payer: Self-pay | Admitting: Obstetrics and Gynecology

## 2022-05-18 ENCOUNTER — Ambulatory Visit (INDEPENDENT_AMBULATORY_CARE_PROVIDER_SITE_OTHER): Payer: Self-pay | Admitting: Obstetrics and Gynecology

## 2022-05-18 VITALS — BP 104/71 | HR 93

## 2022-05-18 DIAGNOSIS — G8918 Other acute postprocedural pain: Secondary | ICD-10-CM

## 2022-05-18 MED ORDER — ESTRADIOL 0.1 MG/GM VA CREA: TOPICAL_CREAM | VAGINAL | 11 refills | Status: AC

## 2022-05-18 MED ORDER — LIDOCAINE-PRILOCAINE 2.5-2.5 % EX CREA: 1.0000 | TOPICAL_CREAM | CUTANEOUS | 0 refills | Status: AC | PRN

## 2022-05-18 NOTE — Progress Notes (Signed)
[facility]  Date of Visit: 05/18/2022  History of Present Illness: Ms. Monica Bryan is a 32 y.o. female scheduled today for a post-operative visit.   Surgery: s/p Excision of skene's gland cyst, cystoscopy on 04/05/22  She passed her postoperative void trial.   Postoperative course has been uncomplicated.   Today she reports she does not have pain but has a "stabbing" sensation near the incision site. This comes and goes. Does not need pain medication.   UTI in the last 6 weeks? No  Pain?  See above Stress incontinence: No  Urgency/frequency: No  Urge incontinence: No  Voiding dysfunction: No    Pathology results: SUBURETHRAL CYST, EXCISION:  Benign cyst lined by columnar, transitional and squamous epithelium with  associated chronic inflammation and hemosiderin laden macrophages  compatible with Skene's gland cyst  Negative for malignancy   Medications: She has a current medication list which includes the following prescription(s): acetaminophen, [START ON 05/20/2022] estradiol, ibuprofen, lidocaine-prilocaine, and prenatal vit-fe fumarate-fa.   Allergies: Patient has No Known Allergies.   Physical Exam: BP 104/71   Pulse 93   Breastfeeding Yes    Pelvic Examination: Vagina: Incisions healing well. Sutures are present at incision line and there is not granulation tissue. Tenderness with q-tip at the the anterior incision line and posterior fourchette   ---------------------------------------------------------  Assessment and Plan:  1. Post-operative pain     - Patient is currently breastfeed so this may be contributing to vaginal dryness and pain near incision line. Otherwise healing well.  - Prescribed estrace cream 0.5g nightly for two weeks then twice a week after.  - Also prescribed lidocaine/ prilocaine cream to use as needed  Return 1 month for follow up  Jaquita Folds, MD

## 2022-06-15 ENCOUNTER — Ambulatory Visit (INDEPENDENT_AMBULATORY_CARE_PROVIDER_SITE_OTHER): Payer: Self-pay | Admitting: Obstetrics and Gynecology

## 2022-06-15 ENCOUNTER — Encounter: Payer: Self-pay | Admitting: Obstetrics and Gynecology

## 2022-06-15 VITALS — BP 103/70 | HR 83

## 2022-06-15 DIAGNOSIS — N898 Other specified noninflammatory disorders of vagina: Secondary | ICD-10-CM

## 2022-06-15 DIAGNOSIS — G8918 Other acute postprocedural pain: Secondary | ICD-10-CM

## 2022-06-15 NOTE — Patient Instructions (Signed)
Continue to use the estrogen cream

## 2022-06-15 NOTE — Progress Notes (Signed)
[facility] Return Visit  SUBJECTIVE  History of Present Illness: Azaria Kasai is a 32 y.o. female seen in follow-up for vaginal pain post surgery. Plan at last visit was start EMLA cream and Estrogen cream vaginally. She is actively breastfeeding which is thought to be contributing to her vaginal atrophy symptoms post surgery.    Reports her symptoms have completely resolved. She is still doing the estrogen cream a few times per week but reports she did not need the EMLA cream.    Past Medical History: Patient  has a past medical history of Family history of diabetes mellitus (DM) (02/15/2014), GERD (gastroesophageal reflux disease), Headache, History of Bell's palsy, History of chlamydia, and Skene's gland cyst.   Past Surgical History: She  has a past surgical history that includes No past surgeries; Urethral cyst removal (N/A, 04/05/2022); and Cystoscopy (N/A, 04/05/2022).   Medications: She has a current medication list which includes the following prescription(s): acetaminophen, estradiol, ibuprofen, lidocaine-prilocaine, and prenatal vit-fe fumarate-fa.   Allergies: Patient has No Known Allergies.   Social History: Patient  reports that she has never smoked. She has never used smokeless tobacco. She reports that she does not drink alcohol and does not use drugs.      OBJECTIVE     Physical Exam: Vitals:   06/15/22 1001  BP: 103/70  Pulse: 83   Gen: No apparent distress, A&O x 3.  Detailed Urogynecologic Evaluation:  Deferred.    ASSESSMENT AND PLAN    Ms. Dubon-Garcia is a 32 y.o. with:  1. Post-operative pain   2. Vaginal dryness    She reports her vaginal dryness and the sutures have healed and dissolved. She has not had any more pain. She feels she is healed now. We discussed that she can continue to use the estrogen cream twice a week while she is breastfeeding to assist her vaginal tissues but once she stops breastfeeding she  should stop the cream.  Reports understanding of use for estrogen cream. Encouraged her to follow up if she has any concerns about her healing.   Patient reports she will call if she needs an appointment or has any other concerns. Follow up PRN.

## 2023-06-19 ENCOUNTER — Encounter (HOSPITAL_COMMUNITY): Payer: Self-pay | Admitting: *Deleted

## 2023-06-19 ENCOUNTER — Other Ambulatory Visit: Payer: Self-pay

## 2023-06-19 ENCOUNTER — Emergency Department (HOSPITAL_COMMUNITY)
Admission: EM | Admit: 2023-06-19 | Discharge: 2023-06-20 | Disposition: A | Discharge status: Home / Self Care | Payer: Self-pay | Attending: Emergency Medicine | Admitting: Emergency Medicine

## 2023-06-19 DIAGNOSIS — K644 Residual hemorrhoidal skin tags: Secondary | ICD-10-CM | POA: Insufficient documentation

## 2023-06-19 DIAGNOSIS — K625 Hemorrhage of anus and rectum: Secondary | ICD-10-CM

## 2023-06-19 LAB — CBC
HCT: 39.5 % (ref 36.0–46.0)
Hemoglobin: 12.8 g/dL (ref 12.0–15.0)
MCH: 30.8 pg (ref 26.0–34.0)
MCHC: 32.4 g/dL (ref 30.0–36.0)
MCV: 95 fL (ref 80.0–100.0)
Platelets: 262 10*3/uL (ref 150–400)
RBC: 4.16 MIL/uL (ref 3.87–5.11)
RDW: 13.3 % (ref 11.5–15.5)
WBC: 6.7 10*3/uL (ref 4.0–10.5)
nRBC: 0 % (ref 0.0–0.2)

## 2023-06-19 LAB — LIPASE, BLOOD: Lipase: 35 U/L (ref 11–51)

## 2023-06-19 LAB — COMPREHENSIVE METABOLIC PANEL WITH GFR
ALT: 43 U/L (ref 0–44)
AST: 47 U/L — ABNORMAL HIGH (ref 15–41)
Albumin: 3.8 g/dL (ref 3.5–5.0)
Alkaline Phosphatase: 87 U/L (ref 38–126)
Anion gap: 11 (ref 5–15)
BUN: 21 mg/dL — ABNORMAL HIGH (ref 6–20)
CO2: 23 mmol/L (ref 22–32)
Calcium: 9.2 mg/dL (ref 8.9–10.3)
Chloride: 107 mmol/L (ref 98–111)
Creatinine, Ser: 0.67 mg/dL (ref 0.44–1.00)
GFR, Estimated: >60 mL/min (ref >60)
Glucose, Bld: 114 mg/dL — ABNORMAL HIGH (ref 70–99)
Potassium: 3.8 mmol/L (ref 3.5–5.1)
Sodium: 141 mmol/L (ref 135–145)
Total Bilirubin: 0.3 mg/dL (ref 0.0–1.2)
Total Protein: 6.4 g/dL — ABNORMAL LOW (ref 6.5–8.1)

## 2023-06-19 LAB — HCG, SERUM, QUALITATIVE: Preg, Serum: NEGATIVE

## 2023-06-19 LAB — URINALYSIS, ROUTINE W REFLEX MICROSCOPIC
Bilirubin Urine: NEGATIVE
Glucose, UA: NEGATIVE mg/dL
Hgb urine dipstick: NEGATIVE
Ketones, ur: NEGATIVE mg/dL
Leukocytes,Ua: NEGATIVE
Nitrite: NEGATIVE
Protein, ur: NEGATIVE mg/dL
Specific Gravity, Urine: 1.028 (ref 1.005–1.030)
pH: 7 (ref 5.0–8.0)

## 2023-06-19 NOTE — ED Triage Notes (Signed)
 Pt noticed rectal bleeding one hour ago  at home she thinks she may have hemorrhoids not sure lmp one year ago when she delivered

## 2023-06-20 LAB — POC OCCULT BLOOD, ED: Fecal Occult Bld: POSITIVE — AB

## 2023-06-20 MED ORDER — HYDROCORTISONE ACETATE 25 MG RE SUPP: 25.0000 mg | Freq: Two times a day (BID) | RECTAL | 0 refills | Status: AC

## 2023-06-20 MED ORDER — POLYETHYLENE GLYCOL 3350 17 G PO PACK: 17.0000 g | PACK | Freq: Every day | ORAL | 0 refills | Status: AC

## 2023-06-20 NOTE — ED Notes (Signed)
 chaperoned a rectal exam for Monica Bryan, [place]

## 2023-06-20 NOTE — Discharge Instructions (Addendum)
 Como ya se mencion, presenta evidencia de una hemorroide bastante grande que Tour manager. Le recetar supositorios rectales. Tmelos segn las indicaciones. Tambin comenzar con un medicamento llamado MiraLAX. Recomiendo comenzar con 1 a 4 sobres de MiraLAX y aumentar la dosis si persiste el estreimiento, o disminuirla si presenta diarrea. Tambin recomendar baos de asiento; consulte la informacin adjunta a la documentacin de alta sobre cmo Biochemist, clinical. Adjuntar informacin para que un cirujano le haga seguimiento si persiste con sntomas, ya que a veces requieren intervencin [place]. No dude en regresar a urgencias si se presentan los signos y sntomas preocupantes que mencionamos.  As discussed, you do have evidence of rather large hemorrhoid that appears to be bleeding.  Will place you on rectal suppository medication.  Please take as directed.  Will also begin a medication called MiraLAX.  I recommend beginning with 1 to 4 packets of MiraLAX and increasing if you still feel constipated or decrease if you develop diarrhea.  Will also recommend sitz bath's; see information attached to discharge papers on how to do these baths.  Will attach information for a surgeon to follow-up with if you continue to be symptomatic as these sometimes require surgical intervention.  Please do not hesitate to return to the emergency department if the worrisome signs and symptoms we discussed become apparent.

## 2023-06-20 NOTE — ED Provider Notes (Signed)
 [place] EMERGENCY DEPARTMENT AT [facility] Provider Note   CSN: [id number] Arrival date & time: 06/19/23  2119     History  Chief Complaint  Patient presents with   Rectal Bleeding    Monica Bryan is a 33 y.o. female.   Rectal Bleeding   33 year old female presents emergency department with complaints of rectal bleeding.  States that she was attempting to have a bowel movement earlier today felt constipated.  States that she was straining harder.  Reports noticing blood in the toilet afterwards and upon wiping.  Denies history of similar symptoms in the past.  States that she did feel a new mass near her rectum.  Denies any nausea, vomiting, blood thinner use, history of known hemorrhoids.  Has tried no medication for her symptoms.  Past medical history significant for GERD, headache, Bell's palsy  Home Medications Prior to Admission medications   Medication Sig Start Date End Date Taking? Authorizing Provider  hydrocortisone (ANUSOL-HC) 25 MG suppository Place 1 suppository (25 mg total) rectally 2 (two) times daily. 06/20/23  Yes Sherian Maroon A, PA  polyethylene glycol (MIRALAX) 17 g packet Take 17 g by mouth daily. 06/20/23  Yes Sherian Maroon A, PA  acetaminophen (TYLENOL) 325 MG tablet Take 2 tablets (650 mg total) by mouth every 4 (four) hours as needed (for pain scale < 4). 01/14/22   Autry-Lott, Randa Evens, DO  estradiol (ESTRACE) 0.1 MG/GM vaginal cream Place 0.5g nightly for two weeks then twice a week after 05/20/22   Marguerita Beards, MD  ibuprofen (ADVIL) 600 MG tablet Take 1 tablet (600 mg total) by mouth every 6 (six) hours. 01/14/22   Autry-Lott, Randa Evens, DO  lidocaine-prilocaine (EMLA) cream Apply 1 Application topically as needed. 05/18/22   Marguerita Beards, MD  Prenatal Vit-Fe Fumarate-FA (PRENATAL VITAMINS PO) Take 1 tablet by mouth daily.    [provider]      Allergies    Patient has no known allergies.    Review of  Systems   Review of Systems  Gastrointestinal:  Positive for hematochezia.  All other systems reviewed and are negative.   Physical Exam Updated Vital Signs BP 110/71   Pulse 79   Temp 98.1 F (36.7 C) (Oral)   Resp 15   Ht 5\' 2"  (1.575 m)   Wt 47.7 kg   SpO2 100%   BMI 19.23 kg/m  Physical Exam Vitals and nursing note reviewed. Exam conducted with a chaperone present.  Constitutional:      General: She is not in acute distress.    Appearance: She is well-developed.  HENT:     Head: Normocephalic and atraumatic.  Eyes:     Conjunctiva/sclera: Conjunctivae normal.  Cardiovascular:     Rate and Rhythm: Normal rate and regular rhythm.     Heart sounds: No murmur heard. Pulmonary:     Effort: Pulmonary effort is normal. No respiratory distress.     Breath sounds: Normal breath sounds.  Abdominal:     Palpations: Abdomen is soft.     Tenderness: There is no abdominal tenderness. There is no guarding.  Genitourinary:      Comments: 1.5 cm diameter external hemorrhoid in area as above.  Scab-like area appreciated medially.  No active bleeding present.  Small amount of clay colored stool sent via [facility] of which was positive. Musculoskeletal:        General: No swelling.     Cervical back: Neck supple.  Skin:  General: Skin is warm and dry.     Capillary Refill: Capillary refill takes less than 2 seconds.  Neurological:     Mental Status: She is alert.  Psychiatric:        Mood and Affect: Mood normal.     ED Results / Procedures / Treatments   Labs (all labs ordered are listed, but only abnormal results are displayed) Labs Reviewed  COMPREHENSIVE METABOLIC PANEL WITH GFR - Abnormal; Notable for the following components:      Result Value   Glucose, Bld 114 (*)    BUN 21 (*)    Total Protein 6.4 (*)    AST 47 (*)    All other components within normal limits  URINALYSIS, ROUTINE W REFLEX MICROSCOPIC - Abnormal; Notable for the following components:    APPearance HAZY (*)    All other components within normal limits  POC OCCULT BLOOD, ED - Abnormal; Notable for the following components:   Fecal Occult Bld POSITIVE (*)    All other components within normal limits  LIPASE, BLOOD  CBC  HCG, SERUM, QUALITATIVE    EKG None  Radiology No results found.  Procedures Procedures    Medications Ordered in ED Medications - No data to display  ED Course/ Medical Decision Making/ A&P                                 Medical Decision Making  This patient presents to the ED for concern of rectal bleeding, this involves an extensive number of treatment options, and is a complaint that carries with it a high risk of complications and morbidity.  The differential diagnosis includes hemorrhoid, fissure, diverticulosis, mesenteric ischemia, volvulus, intussusception, other   Co morbidities that complicate the patient evaluation  See HPI   Additional history obtained:  Additional history obtained from EMR External records from outside source obtained and reviewed including hospital records   Lab Tests:  I Ordered, and personally interpreted labs.  The pertinent results include: No leukocytosis.  No evidence of anemia.  Platelets within range.  Electra abnormalities.  Isolated elevated BUN.  Nonspecific mild elevation of AST of 47.  No renal dysfunction.  UA without abnormality.  Lipase within normal limits.  hCG negative.  Occult positive   Imaging Studies ordered:  N/a   Cardiac Monitoring: / EKG:  The patient was maintained on a cardiac monitor.  I personally viewed and interpreted the cardiac monitored which showed an underlying rhythm of: sinus rhythm   Consultations Obtained:  N/a   Problem List / ED Course / Critical interventions / Medication management  Rectal bleeding Reevaluation of the patient showed that the patient stayed the same I have reviewed the patients home medicines and have made adjustments as  needed   Social Determinants of Health:  Denies tobacco,'s or drug use.   Test / Admission - Considered:  Rectal bleeding Vitals signs within normal range and stable throughout visit. Laboratory studies significant for: See 33 year old female presents emergency department with complaints of rectal bleeding.  States that she was attempting to have a bowel movement earlier today felt constipated.  States that she was straining harder.  Reports noticing blood in the toilet afterwards and upon wiping.  Denies history of similar symptoms in the past.  States that she did feel a new mass near her rectum.  Denies any nausea, vomiting, blood thinner use, history of known hemorrhoids.  Has  tried no medication for her symptoms. On exam, no reproducible abdominal tenderness.  Rectal exam did show external hemorrhoid nonthrombosed with scab-like area appreciated medially as above.  Suspect that patient's rectal bleeding was likely from external hemorrhoid.  Hemoglobin showed no anemia.  Will place patient on hydrocortisone suppository, recommend sitz bath's, MiraLAX titrated to effect as well as other symptomatic therapy described in AVS.  Follow-up with PCP for reevaluation of symptoms.  Treatment plan discussed with patient and she acknowledged understanding was agreeable to said plan.  Patient will well-appearing, afebrile in no acute distress.   Worrisome signs and symptoms were discussed with the patient, and the patient acknowledged understanding to return to the ED if noticed. Patient was stable upon discharge.          Final Clinical Impression(s) / ED Diagnoses Final diagnoses:  Rectal bleeding  External hemorrhoid    Rx / DC Orders ED Discharge Orders          Ordered    hydrocortisone (ANUSOL-HC) 25 MG suppository  2 times daily        06/20/23 0318    polyethylene glycol (MIRALAX) 17 g packet  Daily        06/20/23 0318              Peter Garter, PA 06/20/23  1478    Shon Baton, MD 06/20/23 [phone]
